# Patient Record
Sex: Male | Born: 1990 | Race: Black or African American | Hispanic: No | Marital: Single | Smoking: Former smoker
Health system: Southern US, Community
[De-identification: ages and names within clinical notes are randomized; demographics above are authoritative.]

## PROBLEM LIST (undated history)

## (undated) DIAGNOSIS — K649 Unspecified hemorrhoids: Secondary | ICD-10-CM

## (undated) DIAGNOSIS — T884XXA Failed or difficult intubation, initial encounter: Secondary | ICD-10-CM

---
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
## 2009-12-19 ENCOUNTER — Emergency Department (HOSPITAL_COMMUNITY): Admission: EM | Admit: 2009-12-19 | Discharge: 2009-12-19 | Payer: Self-pay | Admitting: Emergency Medicine

## 2010-06-22 ENCOUNTER — Emergency Department (HOSPITAL_COMMUNITY): Admission: EM | Admit: 2010-06-22 | Discharge: 2010-06-22 | Payer: Self-pay | Admitting: Emergency Medicine

## 2010-12-21 LAB — URINALYSIS, ROUTINE W REFLEX MICROSCOPIC
Bilirubin Urine: NEGATIVE
Glucose, UA: NEGATIVE mg/dL
Hgb urine dipstick: NEGATIVE
Ketones, ur: NEGATIVE mg/dL
Protein, ur: NEGATIVE mg/dL

## 2010-12-21 LAB — URINE MICROSCOPIC-ADD ON

## 2011-03-07 ENCOUNTER — Emergency Department (HOSPITAL_COMMUNITY)
Admission: EM | Admit: 2011-03-07 | Discharge: 2011-03-08 | Disposition: A | Discharge status: Home / Self Care | Payer: Self-pay | Attending: Emergency Medicine | Admitting: Emergency Medicine

## 2011-03-07 DIAGNOSIS — R51 Headache: Secondary | ICD-10-CM | POA: Insufficient documentation

## 2011-03-07 DIAGNOSIS — Y92410 Unspecified street and highway as the place of occurrence of the external cause: Secondary | ICD-10-CM | POA: Insufficient documentation

## 2011-03-07 DIAGNOSIS — S139XXA Sprain of joints and ligaments of unspecified parts of neck, initial encounter: Secondary | ICD-10-CM | POA: Insufficient documentation

## 2011-03-08 ENCOUNTER — Encounter (HOSPITAL_COMMUNITY): Payer: Self-pay

## 2011-03-08 ENCOUNTER — Emergency Department (HOSPITAL_COMMUNITY): Payer: Self-pay

## 2011-03-29 ENCOUNTER — Emergency Department (HOSPITAL_COMMUNITY)
Admission: EM | Admit: 2011-03-29 | Discharge: 2011-03-30 | Disposition: A | Discharge status: Other Acute Inpatient Hospital | Payer: Self-pay | Attending: Emergency Medicine | Admitting: Emergency Medicine

## 2011-03-29 DIAGNOSIS — R45851 Suicidal ideations: Secondary | ICD-10-CM | POA: Insufficient documentation

## 2011-03-29 DIAGNOSIS — Z5900 Homelessness unspecified: Secondary | ICD-10-CM | POA: Insufficient documentation

## 2011-03-29 DIAGNOSIS — F3289 Other specified depressive episodes: Secondary | ICD-10-CM | POA: Insufficient documentation

## 2011-03-29 DIAGNOSIS — F329 Major depressive disorder, single episode, unspecified: Secondary | ICD-10-CM | POA: Insufficient documentation

## 2011-03-29 LAB — COMPREHENSIVE METABOLIC PANEL
ALT: 22 U/L (ref 0–53)
AST: 24 U/L (ref 0–37)
CO2: 28 mEq/L (ref 19–32)
Chloride: 102 mEq/L (ref 96–112)
GFR calc non Af Amer: >60 mL/min (ref >60)
Total Bilirubin: 0.7 mg/dL (ref 0.3–1.2)

## 2011-03-29 LAB — DIFFERENTIAL
Basophils Absolute: 0 10*3/uL (ref 0.0–0.1)
Basophils Relative: 0 % (ref 0–1)
Eosinophils Relative: 2 % (ref 0–5)
Lymphs Abs: 1.9 10*3/uL (ref 0.7–4.0)
Monocytes Relative: 12 % (ref 3–12)

## 2011-03-29 LAB — ETHANOL: Alcohol, Ethyl (B): <11 mg/dL (ref 0–11)

## 2011-03-29 LAB — CBC
Platelets: 279 10*3/uL (ref 150–400)
RDW: 12.4 % (ref 11.5–15.5)

## 2011-03-30 ENCOUNTER — Inpatient Hospital Stay (HOSPITAL_COMMUNITY)
Admission: AD | Admit: 2011-03-30 | Discharge: 2011-04-02 | DRG: 885 | Disposition: A | Discharge status: Home / Self Care | Payer: PRIVATE HEALTH INSURANCE | Attending: Psychiatry | Admitting: Psychiatry

## 2011-03-30 DIAGNOSIS — F329 Major depressive disorder, single episode, unspecified: Principal | ICD-10-CM

## 2011-03-30 DIAGNOSIS — R45851 Suicidal ideations: Secondary | ICD-10-CM

## 2011-03-30 DIAGNOSIS — Z560 Unemployment, unspecified: Secondary | ICD-10-CM

## 2011-03-30 DIAGNOSIS — F1210 Cannabis abuse, uncomplicated: Secondary | ICD-10-CM

## 2011-03-30 DIAGNOSIS — Z818 Family history of other mental and behavioral disorders: Secondary | ICD-10-CM

## 2011-03-31 DIAGNOSIS — F39 Unspecified mood [affective] disorder: Secondary | ICD-10-CM

## 2011-04-02 NOTE — H&P (Signed)
NAMEFILMORE, MOLYNEUX NO.:  [id number]  MEDICAL RECORD NO.:  [id number]  LOCATION:  0504                          FACILITY:  [facility]  PHYSICIAN:  Franchot Gallo, MD     DATE OF BIRTH:  [birth year]  DATE OF ADMISSION:  03/30/2011 DATE OF DISCHARGE:                      PSYCHIATRIC ADMISSION ASSESSMENT   IDENTIFYING INFORMATION:  This is a 20 year old male who was involuntarily petitioned on 03/30/2011.  HISTORY OF PRESENT ILLNESS:  The patient presents with papers, stating that the patient has suicidal thoughts.  The patient tried to strangle himself in the Emergency Room, having thoughts of cutting himself; several stressors and triggers for increasing depression and suicidal thoughts.  The respondent's main stressor consists of being part of a gang that he is unable to get out of.  The patient does report that he is having a lot of stress with the mothers of his children.  He has two children and one on the way: one born last week, a 96-year-old, and one girlfriend pregnant.  He states that they have not been wanting him to see his children, but then they have changed their minds, and the situation may change.  He does report depression, currently denying any suicidal thoughts.  He does report that he tried to strangle himself in the Emergency Room but denies any thoughts currently.  He states that he currently is out of the gang.  The patient also endorses having difficulty with anger, lashing out and getting agitated at times.  PAST PSYCHIATRIC HISTORY:  His first admission to the [facility].  No other psychiatric admissions.  Reports being prescribed an anti-depressant in the past but has not taken it and cannot recall the name.  SOCIAL HISTORY:  A 20 year old male who lives with his mother.  He has a brother as well who resides in the home.  He is single, but he does have two children and one on the way.  He is currently unemployed.  FAMILY  HISTORY:  Mother with a history of depression, not on any medications.  ALCOHOL AND DRUG HISTORY:  Urine drug screen is positive for marijuana.  PRIMARY CARE PROVIDER:  None.  MEDICAL PROBLEMS:  The patient denies any acute or chronic health issues.  MEDICATIONS:  None.  DRUG ALLERGIES:  None.  PHYSICAL EXAM:  This is a well-developed male, assessed in the Emergency Department; no significant findings.  Laboratory data shows a urine drug screen positive for cannabis; alcohol level less than 11; CBC and CMP within normal limits.  MENTAL STATUS EXAM:  He is in bed.  He is easily awakened and cooperative.  Speech is soft-spoken, polite.  Mood is depressed.  He does appear somewhat sad.  Thought processes are coherent, goal- directed.  No evidence of any psychotic symptoms.  Cognitive function intact.  Memory appears intact.  Judgment and insight are fair.  DIAGNOSIS:  AXIS I:  Major depressive disorder, cannabis abuse. AXIS II:  Deferred. AXIS III:  No known medical conditions. AXIS IV:  Problems with occupation, possible psychosocial problems. AXIS V:  Current is 30.  Our plan is to initiate Celexa, which was discussed with the patient. The patient is agreeable to medications.  Will contact mother for support concerns.  We will address his substance use and will continue to assess comorbidities.  His tentative length of stay at this time is 3 to 5 days.     Landry Corporal, N.P.   ______________________________ Franchot Gallo, MD    JO/MEDQ  D:  03/31/2011  T:  03/31/2011  Job:  [id number]  Electronically Signed by Limmie PatriciaP. on 04/02/2011 10:25:46 AM Electronically Signed by Franchot Gallo MD on 04/02/2011 02:10:07 PM

## 2011-04-02 NOTE — Discharge Summary (Signed)
NAMECECIL, VANDYKE NO.:  [id number]  MEDICAL RECORD NO.:  [id number]  LOCATION:  0504                          FACILITY:  [facility]  PHYSICIAN:  Franchot Gallo, MD     DATE OF BIRTH:  [birth year]  DATE OF ADMISSION:  03/30/2011 DATE OF DISCHARGE:  04/02/2011                              DISCHARGE SUMMARY   Darryl Lloyd was admitted to [facility] on March 30, 2011, after being evaluated in the emergency department of [facility] and voicing severe depression as well as suicidal ideations.  There was also mention in the ED that he attempted to "strangle himself" while in the ED.  The patient was seen by Dr. Lolly Mustache who was the on-call physician on March 31, 2011, at approximately 10:00 a.m.  He was placed on Celexa at 10 mg p.o. q.a.m. as well as Vistaril 50 mg for sleep.  The patient was next seen by the on-call staff on April 01, 2011, and at that time stated that he was "ready to go home".  He reportedly denied any suicidal or homicidal ideations and denied any side effects to the medication, Celexa.  The patient was first seen by this physician on April 02, 2011, and reported he was feeling well.  He stated he was sleeping well without difficulty and reported a good appetite and rated his depression as a "zero" on a scale of 1-10.  He denied any feelings of sadness, anhedonia or depressed mood.  He adamantly denied any suicidal or homicidal ideations and also denied any auditory or visual hallucinations or delusional thinking.  He stated that his anxiety was under good control and he denied any medication related side effects.  The patient requested to be discharged from the hospital and after meeting with the treatment team it was decided that he would be appropriate for discharge.  The nurse practitioner chose to increase his medication, Celexa, to the starting dose of 20 mg p.o. q.a.m. just prior to discharge.  FINAL DIAGNOSES:   AXIS I:  Major  depressive disorder, single episode,          currently under good control.   Cannabis Abuse. AXIS II:  Deferred. AXIS III:  No health issues reported. AXIS IV:  Some difficulty with primary support system.  Unemployment. Financial constraints. AXIS V:  GAF at time of admission approximately 35.  GAF at time of discharge approximately 70.  DISCHARGE MEDICATIONS:   1.  Celexa 20 mg p.o. q.a.m.  CONDITION AT DISCHARGE:  The patient was alert and oriented x3 and was friendly and cooperative with this provider.  Speech was appropriate in terms of rate and volume.  Mood was euthymic.  Affect was bright and full.  Thoughts, the patient denied any obvious delusions or hallucinations nor did he report any suicidal or homicidal ideations. Judgment and insight both appeared to be fair to good.  FOLLOWUP INSTRUCTIONS:  The patient will follow up at [facility] tomorrow April 03, 2011, between 8 am and 11 am for continuing followup of his  psychiatric symptoms.    _________________________________ Franchot Gallo, MD     RR/MEDQ  D:  04/02/2011  T:  04/02/2011  Job:  [id number]  Electronically Signed by Franchot Gallo MD on 04/02/2011 02:59:59 PM

## 2011-06-11 ENCOUNTER — Emergency Department (HOSPITAL_COMMUNITY): Payer: Self-pay

## 2011-06-11 ENCOUNTER — Emergency Department (HOSPITAL_COMMUNITY)
Admission: EM | Admit: 2011-06-11 | Discharge: 2011-06-11 | Disposition: A | Discharge status: Home / Self Care | Payer: Self-pay | Attending: Emergency Medicine | Admitting: Emergency Medicine

## 2011-06-11 DIAGNOSIS — R109 Unspecified abdominal pain: Secondary | ICD-10-CM | POA: Insufficient documentation

## 2011-06-11 DIAGNOSIS — R42 Dizziness and giddiness: Secondary | ICD-10-CM | POA: Insufficient documentation

## 2011-06-11 DIAGNOSIS — K625 Hemorrhage of anus and rectum: Secondary | ICD-10-CM | POA: Insufficient documentation

## 2011-06-11 LAB — CBC
Hemoglobin: 14 g/dL (ref 13.0–17.0)
MCH: 30.3 pg (ref 26.0–34.0)
MCV: 87.9 fL (ref 78.0–100.0)
Platelets: 309 10*3/uL (ref 150–400)
RDW: 12.4 % (ref 11.5–15.5)
WBC: 5.6 10*3/uL (ref 4.0–10.5)

## 2011-06-11 LAB — DIFFERENTIAL
Eosinophils Relative: 3 % (ref 0–5)
Lymphocytes Relative: 43 % (ref 12–46)
Monocytes Absolute: 0.6 10*3/uL (ref 0.1–1.0)
Monocytes Relative: 11 % (ref 3–12)
Neutro Abs: 2.4 10*3/uL (ref 1.7–7.7)

## 2011-06-11 LAB — COMPREHENSIVE METABOLIC PANEL
ALT: 18 U/L (ref 0–53)
Alkaline Phosphatase: 91 U/L (ref 39–117)
BUN: 6 mg/dL (ref 6–23)
CO2: 28 mEq/L (ref 19–32)
Calcium: 9.4 mg/dL (ref 8.4–10.5)
Creatinine, Ser: 0.68 mg/dL (ref 0.50–1.35)
Sodium: 141 mEq/L (ref 135–145)
Total Bilirubin: 0.2 mg/dL — ABNORMAL LOW (ref 0.3–1.2)

## 2011-06-11 MED ORDER — IOHEXOL 300 MG/ML  SOLN
80.0000 mL | Freq: Once | INTRAMUSCULAR | Status: AC | PRN
  Administered 2011-06-11: 80 mL via INTRAVENOUS

## 2011-08-02 ENCOUNTER — Emergency Department (HOSPITAL_COMMUNITY)
Admission: EM | Admit: 2011-08-02 | Discharge: 2011-08-02 | Disposition: A | Discharge status: Home / Self Care | Payer: Self-pay | Attending: Emergency Medicine | Admitting: Emergency Medicine

## 2011-08-02 DIAGNOSIS — Z79899 Other long term (current) drug therapy: Secondary | ICD-10-CM | POA: Insufficient documentation

## 2011-08-02 DIAGNOSIS — F329 Major depressive disorder, single episode, unspecified: Secondary | ICD-10-CM | POA: Insufficient documentation

## 2011-08-02 DIAGNOSIS — K649 Unspecified hemorrhoids: Secondary | ICD-10-CM | POA: Insufficient documentation

## 2011-08-02 DIAGNOSIS — F411 Generalized anxiety disorder: Secondary | ICD-10-CM | POA: Insufficient documentation

## 2011-08-02 DIAGNOSIS — F3289 Other specified depressive episodes: Secondary | ICD-10-CM | POA: Insufficient documentation

## 2011-08-02 LAB — COMPREHENSIVE METABOLIC PANEL
ALT: 22 U/L (ref 0–53)
AST: 18 U/L (ref 0–37)
Albumin: 4.4 g/dL (ref 3.5–5.2)
Alkaline Phosphatase: 107 U/L (ref 39–117)
CO2: 28 mEq/L (ref 19–32)
Chloride: 104 mEq/L (ref 96–112)
GFR calc non Af Amer: >90 mL/min (ref >90)
Potassium: 3.5 mEq/L (ref 3.5–5.1)
Sodium: 140 mEq/L (ref 135–145)
Total Bilirubin: 0.3 mg/dL (ref 0.3–1.2)

## 2011-08-02 LAB — CBC
MCH: 30.3 pg (ref 26.0–34.0)
RBC: 4.78 MIL/uL (ref 4.22–5.81)

## 2011-08-02 LAB — DIFFERENTIAL
Basophils Relative: 0 % (ref 0–1)
Eosinophils Absolute: 0.1 10*3/uL (ref 0.0–0.7)
Eosinophils Relative: 2 % (ref 0–5)
Lymphocytes Relative: 55 % — ABNORMAL HIGH (ref 12–46)
Lymphs Abs: 3.4 10*3/uL (ref 0.7–4.0)
Monocytes Absolute: 0.5 10*3/uL (ref 0.1–1.0)

## 2011-08-02 LAB — OCCULT BLOOD, POC DEVICE: Fecal Occult Bld: NEGATIVE

## 2011-08-02 LAB — RAPID URINE DRUG SCREEN, HOSP PERFORMED
Barbiturates: NOT DETECTED
Tetrahydrocannabinol: POSITIVE — AB

## 2013-09-16 ENCOUNTER — Encounter (HOSPITAL_COMMUNITY): Payer: Self-pay | Admitting: Emergency Medicine

## 2013-09-16 ENCOUNTER — Emergency Department (HOSPITAL_COMMUNITY): Payer: Self-pay

## 2013-09-16 ENCOUNTER — Emergency Department (HOSPITAL_COMMUNITY)
Admission: EM | Admit: 2013-09-16 | Discharge: 2013-09-16 | Disposition: A | Discharge status: Home / Self Care | Payer: Self-pay | Attending: Emergency Medicine | Admitting: Emergency Medicine

## 2013-09-16 DIAGNOSIS — M25572 Pain in left ankle and joints of left foot: Secondary | ICD-10-CM

## 2013-09-16 DIAGNOSIS — M25579 Pain in unspecified ankle and joints of unspecified foot: Secondary | ICD-10-CM | POA: Insufficient documentation

## 2013-09-16 DIAGNOSIS — F17200 Nicotine dependence, unspecified, uncomplicated: Secondary | ICD-10-CM | POA: Insufficient documentation

## 2013-09-16 MED ORDER — MELOXICAM 7.5 MG PO TABS: 15.0000 mg | ORAL_TABLET | Freq: Every day | ORAL | Status: DC

## 2013-09-16 MED ORDER — KETOROLAC TROMETHAMINE 60 MG/2ML IM SOLN
60.0000 mg | Freq: Once | INTRAMUSCULAR | Status: AC
  Administered 2013-09-16: 60 mg via INTRAMUSCULAR
  Filled 2013-09-16: qty 2

## 2013-09-16 NOTE — ED Notes (Signed)
Left ankle pain, sudden onset today, no trauma, no deformity.

## 2013-09-16 NOTE — ED Provider Notes (Signed)
Medical screening examination/treatment/procedure(s) were performed by non-physician practitioner and as supervising physician I was immediately available for consultation/collaboration.  [name], MD 09/16/13 [phone]

## 2013-09-16 NOTE — ED Provider Notes (Signed)
CSN: [id number]     Arrival date & time 09/16/13  1333 History   First MD Initiated Contact with Patient 09/16/13 1400    This chart was scribed for Darryl Silk  PA-C, a non-physician practitioner working with Darryl Baker, MD by Darryl Lloyd, ED Scribe. This patient was seen in room TR06C/TR06C and the patient's care was started at 2:26 PM     Chief Complaint  Patient presents with  . Ankle Pain   (Consider location/radiation/quality/duration/timing/severity/associated sxs/prior Treatment) The history is provided by the patient. No language interpreter was used.   HPI Comments: Darryl Lloyd is a 22 y.o. male who presents to the Emergency Department complaining of constant severe medial left ankle pain onset today while walking. Describes pain as sharp without radiation. Reports pain is exacerbated by movement and weight bearing. Denies trying any medications PTA to relieve symptoms. Denies associated injury, numbness, and weakness. Denies pertinent PMHx.   History reviewed. No pertinent past medical history. History reviewed. No pertinent past surgical history. No family history on file. History  Substance Use Topics  . Smoking status: Current Every Day Smoker  . Smokeless tobacco: Not on file  . Alcohol Use: No    Review of Systems  Constitutional: Negative for fever.  Musculoskeletal: Positive for arthralgias.  Psychiatric/Behavioral: Negative for confusion.    A complete 10 system review of systems was obtained and all systems are negative except as noted in the HPI and PMHx.    Allergies  Review of patient's allergies indicates no known allergies.  Home Medications  No current outpatient prescriptions on file. BP 103/59  Pulse 70  Temp(Src) 98.3 F (36.8 C) (Oral)  Resp 16  SpO2 99% Physical Exam  Nursing note and vitals reviewed. Constitutional: He is oriented to person, place, and time. He appears well-developed and well-nourished. No distress.   HENT:  Head: Normocephalic and atraumatic.  Right Ear: External ear normal.  Left Ear: External ear normal.  Nose: Nose normal.  Eyes: Conjunctivae are normal.  Neck: Normal range of motion. No tracheal deviation present.  Cardiovascular: Normal rate, regular rhythm and normal heart sounds.   Pulmonary/Chest: Effort normal and breath sounds normal. No stridor.  Abdominal: Soft. He exhibits no distension. There is no tenderness.  Musculoskeletal: Normal range of motion.    Ankle Tenderness entire joint, Distal fibula non-tender, Medial malleolus TTP, Achilles non-tender, Proximal fibula non-tender, Proximal 5th metatarsal non-tender, Midfoot non-tender, distally Neurovascularly Intact with baseline sensation/motor to foot with Cap refill<2 seconds.   Neurological: He is alert and oriented to person, place, and time.  Skin: Skin is warm and dry. He is not diaphoretic. No erythema.  Left ankle: Compartment soft, no erythema, no swelling, no ecchymosis    Psychiatric: He has a normal mood and affect. His behavior is normal.    ED Course  Procedures (including critical care time) COORDINATION OF CARE:  Nursing notes reviewed. Vital signs reviewed. Initial pt interview and examination performed.   2:39 PM-Discussed work up plan with pt at bedside, which includes x-ray of left ankle. Pt agrees with plan. 2:39 PM Nursing Notes Reviewed/ Care Coordinated Applicable Imaging Reviewed  Interpretation of Laboratory Data incorporated into ED treatment Discussed results and treatment plan with pt. Pt demonstrates understanding and agrees with plan.   Treatment plan initiated: Medications  ketorolac (TORADOL) injection 60 mg (60 mg Intramuscular Given 09/16/13 1451)     Initial diagnostic testing ordered.    Labs Review Labs Reviewed - No data to display  Imaging Review Dg Ankle Complete Left  09/16/2013   CLINICAL DATA:  Ankle pain, no trauma  EXAM: LEFT ANKLE COMPLETE - 3+ VIEW   COMPARISON:  02/28/2008  FINDINGS: There is no evidence of fracture, dislocation, or joint effusion. There is no evidence of arthropathy or other focal bone abnormality.  IMPRESSION: Negative.   Electronically Signed   By: Darryl Lloyd M.D.   On: 09/16/2013 14:29    EKG Interpretation   None       MDM   1. Ankle pain, left    Patient presents with sudden onset left ankle pain. Neurovascularly intact. Compartment soft. No swelling, ecchymosis, erythema, rash. XR is negative for acute pathology. Crutches given for comfort. Toradol given. Mobic rx for home. Return instructions given. Vital signs stable for discharge. Patient / Family / Caregiver informed of clinical course, understand medical decision-making process, and agree with plan.   I personally performed the services described in this documentation, which was scribed in my presence. The recorded information has been reviewed and is accurate.     Darryl Bellman, PA-C 09/16/13 [phone]

## 2013-09-16 NOTE — ED Notes (Signed)
Pt states he was walking today and began having severe L ankle pain.  Pt denies any injury.

## 2014-10-14 ENCOUNTER — Encounter (HOSPITAL_COMMUNITY): Payer: Self-pay | Admitting: Emergency Medicine

## 2014-10-14 ENCOUNTER — Emergency Department (HOSPITAL_COMMUNITY): Payer: PRIVATE HEALTH INSURANCE

## 2014-10-14 ENCOUNTER — Emergency Department (HOSPITAL_COMMUNITY)
Admission: EM | Admit: 2014-10-14 | Discharge: 2014-10-14 | Disposition: A | Discharge status: Home / Self Care | Payer: PRIVATE HEALTH INSURANCE | Attending: Emergency Medicine | Admitting: Emergency Medicine

## 2014-10-14 DIAGNOSIS — Z720 Tobacco use: Secondary | ICD-10-CM | POA: Insufficient documentation

## 2014-10-14 DIAGNOSIS — R0789 Other chest pain: Secondary | ICD-10-CM | POA: Insufficient documentation

## 2014-10-14 LAB — BASIC METABOLIC PANEL
ANION GAP: 3 — AB (ref 5–15)
BUN: 12 mg/dL (ref 6–23)
CALCIUM: 8.8 mg/dL (ref 8.4–10.5)
CO2: 29 mmol/L (ref 19–32)
Chloride: 106 mEq/L (ref 96–112)
Creatinine, Ser: 1.1 mg/dL (ref 0.50–1.35)
Glucose, Bld: 66 mg/dL — ABNORMAL LOW (ref 70–99)
Potassium: 3.6 mmol/L (ref 3.5–5.1)
SODIUM: 138 mmol/L (ref 135–145)

## 2014-10-14 LAB — CBC
HCT: 39.1 % (ref 39.0–52.0)
Hemoglobin: 13.1 g/dL (ref 13.0–17.0)
MCH: 30.6 pg (ref 26.0–34.0)
MCHC: 33.5 g/dL (ref 30.0–36.0)
MCV: 91.4 fL (ref 78.0–100.0)
PLATELETS: 291 10*3/uL (ref 150–400)
RBC: 4.28 MIL/uL (ref 4.22–5.81)
RDW: 12.6 % (ref 11.5–15.5)
WBC: 6.1 10*3/uL (ref 4.0–10.5)

## 2014-10-14 LAB — I-STAT TROPONIN, ED: Troponin i, poc: 0 ng/mL (ref 0.00–0.08)

## 2014-10-14 MED ORDER — IBUPROFEN 800 MG PO TABS: 800.0000 mg | ORAL_TABLET | Freq: Three times a day (TID) | ORAL | Status: DC | PRN

## 2014-10-14 MED ORDER — KETOROLAC TROMETHAMINE 60 MG/2ML IM SOLN
60.0000 mg | Freq: Once | INTRAMUSCULAR | Status: AC
  Administered 2014-10-14: 60 mg via INTRAMUSCULAR
  Filled 2014-10-14: qty 2

## 2014-10-14 MED ORDER — HYDROCODONE-ACETAMINOPHEN 5-325 MG PO TABS: 1.0000 | ORAL_TABLET | Freq: Four times a day (QID) | ORAL | Status: DC | PRN

## 2014-10-14 NOTE — Discharge Instructions (Signed)
Return here as needed. Follow up with a primary doctor or urgent care as needed. Use heat on your chest wall.

## 2014-10-14 NOTE — ED Notes (Addendum)
Pt reports R side chest pain that started 2-3 days ago upon awakening. Over time has developed lightheadnesss. Reports sob, but speaking in complete sentences with no difficulty. No cough at home. Pain worse with movement

## 2014-10-14 NOTE — Progress Notes (Signed)
  CARE MANAGEMENT ED NOTE 10/14/2014  Patient:  Darryl Lloyd,Darryl Lloyd   Account Number:  [id number]  Date Initiated:  10/14/2014  Documentation initiated by:  Radford PaxFERRERO,Umair Rosiles  Subjective/Objective Assessment:   Patient presents to Ed with chest pain, sob, lightheadedness     Subjective/Objective Assessment Detail:     Action/Plan:   Action/Plan Detail:   Anticipated DC Date:  10/14/2014     Status Recommendation to Physician:   Result of Recommendation:    Other ED Services  Consult Working Plan    DC Planning Services  Other  PCP issues    Choice offered to / List presented to:            Status of service:  Completed, signed off  ED Comments:   ED Comments Detail:  EDCM spoke to patient at bedside. Patient confirms he does not have a pcp or [place]. EDCM [facility], informed patient of services there and walk in times.  EDCM also provided patient with list of pcps who accept self pay patients, list of discount pharmacies and websites needymeds.org and GoodRX.com for medication assistance, phone number to inquire about the orange card, phone number to inquire about Mediciad, phone number to inquire about the Affordable Care Act, financial resources in the community such as local churches, [employer], urban ministries, and dental assistance for uninsured patients. Patient thankful for resources.  No further EDCM needs at this time.

## 2014-10-14 NOTE — ED Notes (Signed)
PA at bedside.

## 2014-10-14 NOTE — ED Notes (Signed)
PA student at bedside.

## 2014-10-14 NOTE — ED Notes (Signed)
Pt reports Right sided chest pain  Starting 2-3 days ago. He denies and injury or drug use. He c/o pain with movement and pain when leaning back.

## 2014-10-14 NOTE — ED Provider Notes (Signed)
CSN: [id number]     Arrival date & time 10/14/14  1822 History   First MD Initiated Contact with Patient 10/14/14 2018     Chief Complaint  Patient presents with  . Chest Pain     (Consider location/radiation/quality/duration/timing/severity/associated sxs/prior Treatment) Patient is a 24 y.o. male presenting with chest pain.  Chest Pain Pain location:  R lateral chest Pain quality: sharp and stabbing   Pain radiates to:  Precordial region Pain radiates to the back: no   Pain severity:  Moderate Onset quality:  Gradual Duration:  2 days Timing:  Constant Progression:  Unchanged Chronicity:  New Context: breathing, lifting, movement and raising an arm   Context: no drug use, not eating, no intercourse, not at rest, no stress and no trauma   Relieved by:  Nothing Worsened by:  Certain positions, coughing, deep breathing and movement Ineffective treatments:  None tried Associated symptoms: no abdominal pain, no altered mental status, no anorexia, no back pain, no cough, no diaphoresis, no dizziness, no dysphagia, no fatigue, no fever, no headache, no nausea, no near-syncope, no numbness, no orthopnea, no palpitations, no PND, no shortness of breath, no syncope, not vomiting and no weakness   Risk factors: no birth control, no coronary artery disease, no diabetes mellitus, no high cholesterol, no hypertension, no immobilization, not obese, no prior DVT/PE, no smoking and no surgery     History reviewed. No pertinent past medical history. History reviewed. No pertinent past surgical history. History reviewed. No pertinent family history. History  Substance Use Topics  . Smoking status: Current Every Day Smoker  . Smokeless tobacco: Not on file  . Alcohol Use: No    Review of Systems  Constitutional: Negative for fever, diaphoresis and fatigue.  HENT: Negative for trouble swallowing.   Respiratory: Negative for cough and shortness of breath.   Cardiovascular: Positive for chest  pain. Negative for palpitations, orthopnea, syncope, PND and near-syncope.  Gastrointestinal: Negative for nausea, vomiting, abdominal pain and anorexia.  Genitourinary: Negative for dysuria.  Musculoskeletal: Negative for back pain and neck pain.  Skin: Negative for rash.  Neurological: Negative for dizziness, syncope, weakness, numbness and headaches.    All other systems negative except as documented in the HPI. All pertinent positives and negatives as reviewed in the HPI.   Allergies  Review of patient's allergies indicates no known allergies.  Home Medications   Prior to Admission medications   Medication Sig Start Date End Date Taking? Authorizing Provider  meloxicam (MOBIC) 7.5 MG tablet Take 2 tablets (15 mg total) by mouth daily. Patient not taking: Reported on 10/14/2014 09/16/13   Ramon DredgeHannah S Merrell, PA-C   BP 118/76 mmHg  Pulse 75  Temp(Src) 98.2 F (36.8 C) (Oral)  Resp 16  SpO2 99% Physical Exam  Constitutional: He is oriented to person, place, and time. He appears well-developed and well-nourished. No distress.  HENT:  Head: Normocephalic and atraumatic.  Mouth/Throat: Oropharynx is clear and moist.  Eyes: Pupils are equal, round, and reactive to light.  Neck: Normal range of motion. Neck supple.  Cardiovascular: Normal rate, regular rhythm and normal heart sounds.  Exam reveals no gallop and no friction rub.   No murmur heard. Pulmonary/Chest: Effort normal and breath sounds normal. No respiratory distress. He exhibits tenderness.  Abdominal: Soft. Bowel sounds are normal. He exhibits no distension. There is no tenderness.  Musculoskeletal: He exhibits no edema.  Neurological: He is alert and oriented to person, place, and time. He exhibits normal muscle tone.  Coordination normal.  Skin: Skin is warm and dry. No rash noted. No erythema.  Nursing note and vitals reviewed.   ED Course  Procedures (including critical care time) Labs Review Labs Reviewed  BASIC  METABOLIC PANEL - Abnormal; Notable for the following:    Glucose, Bld 66 (*)    Anion gap 3 (*)    All other components within normal limits  CBC  I-STAT TROPOININ, ED    Imaging Review Dg Chest 2 View (if Patient Has Fever And/or Copd)  10/14/2014   CLINICAL DATA:  Right upper chest pain 2 days.  No injury.  EXAM: CHEST  2 VIEW  COMPARISON:  None.  FINDINGS: The heart size and mediastinal contours are within normal limits. Both lungs are clear. The visualized skeletal structures are unremarkable.  IMPRESSION: No active cardiopulmonary disease.   Electronically Signed   By: Elberta Fortis M.D.   On: 10/14/2014 19:06     EKG Interpretation None      The patient will be treated for chest wall pain based on HPI and PE. The patient is advised to return here as needed. Use heat on the area.     Carlyle Dolly, PA-C 10/15/14 0123  Rolland Porter, MD 10/26/14 [phone]

## 2015-02-09 ENCOUNTER — Emergency Department (HOSPITAL_COMMUNITY)
Admission: EM | Admit: 2015-02-09 | Discharge: 2015-02-10 | Disposition: A | Discharge status: Home / Self Care | Payer: PRIVATE HEALTH INSURANCE | Attending: Emergency Medicine | Admitting: Emergency Medicine

## 2015-02-09 DIAGNOSIS — K029 Dental caries, unspecified: Secondary | ICD-10-CM | POA: Insufficient documentation

## 2015-02-09 DIAGNOSIS — K0889 Other specified disorders of teeth and supporting structures: Secondary | ICD-10-CM

## 2015-02-09 DIAGNOSIS — Z720 Tobacco use: Secondary | ICD-10-CM | POA: Insufficient documentation

## 2015-02-09 DIAGNOSIS — K088 Other specified disorders of teeth and supporting structures: Secondary | ICD-10-CM | POA: Insufficient documentation

## 2015-02-10 ENCOUNTER — Encounter (HOSPITAL_COMMUNITY): Payer: Self-pay | Admitting: *Deleted

## 2015-02-10 MED ORDER — TRAMADOL HCL 50 MG PO TABS: 50.0000 mg | ORAL_TABLET | Freq: Four times a day (QID) | ORAL | Status: DC | PRN

## 2015-02-10 MED ORDER — BUPIVACAINE-EPINEPHRINE (PF) 0.5% -1:200000 IJ SOLN
3.6000 mL | Freq: Once | INTRAMUSCULAR | Status: AC
  Administered 2015-02-10: 3.6 mL
  Filled 2015-02-10: qty 3.6

## 2015-02-10 MED ORDER — NAPROXEN 500 MG PO TABS: 500.0000 mg | ORAL_TABLET | Freq: Two times a day (BID) | ORAL | Status: DC

## 2015-02-10 MED ORDER — PENICILLIN V POTASSIUM 500 MG PO TABS: 500.0000 mg | ORAL_TABLET | Freq: Four times a day (QID) | ORAL | Status: AC

## 2015-02-10 MED ORDER — NAPROXEN 500 MG PO TABS
500.0000 mg | ORAL_TABLET | Freq: Once | ORAL | Status: AC
  Administered 2015-02-10: 500 mg via ORAL
  Filled 2015-02-10: qty 1

## 2015-02-10 NOTE — Discharge Instructions (Signed)
Please follow the directions provided. It is very important to follow-up with the dentist tomorrow for further management of this tooth pain. Please take the proximal and twice a day to help with pain and inflammation. Please take the Ultram for pain not relieved by the naproxen. Please take the penicillin until it is all gone. Don't hesitate to return for any new, worsening, or concerning symptoms.   SEEK IMMEDIATE MEDICAL CARE IF:  You have a fever.  You develop redness and swelling of your face, jaw, or neck.  You are unable to open your mouth.  You have severe pain uncontrolled by pain medicine.   Emergency Department Resource Guide 1) Find a Doctor and Pay Out of Pocket Although you won't have to find out who is covered by your insurance plan, it is a good idea to ask around and get recommendations. You will then need to call the office and see if the doctor you have chosen will accept you as a new patient and what types of options they offer for patients who are self-pay. Some doctors offer discounts or will set up payment plans for their patients who do not have insurance, but you will need to ask so you aren't surprised when you get to your appointment.  2) Contact Your Local Health Department Not all health departments have doctors that can see patients for sick visits, but many do, so it is worth a call to see if yours does. If you don't know where your local health department is, you can check in your phone book. The CDC also has a tool to help you locate your state's health department, and many state websites also have listings of all of their local health departments.  3) Find a Walk-in Clinic If your illness is not likely to be very severe or complicated, you may want to try a walk in clinic. These are popping up all over the country in pharmacies, drugstores, and shopping centers. They're usually staffed by nurse practitioners or physician assistants that have been trained to treat  common illnesses and complaints. They're usually fairly quick and inexpensive. However, if you have serious medical issues or chronic medical problems, these are probably not your best option.  No Primary Care Doctor: - Call Health Connect at  [phone] - they can help you locate a primary care doctor that  accepts your insurance, provides certain services, etc. - Physician Referral Service- [phone]  Chronic Pain Problems: Organization         Address  Phone   Notes  [facility]  [phone] Patients need to be referred by their primary care doctor.   Medication Assistance: Organization         Address  Phone   Notes  [facility] [address] [place], [place] [address] [phone] --Must be a resident of [place] -- Must have NO insurance coverage whatsoever (no Medicaid/ Medicare, etc.) -- The pt. MUST have a primary care doctor that directs their care regularly and follows them in the community   MedAssist  [phone]   [employer]  [phone]    Agencies that provide inexpensive medical care: Organization         Address  Phone   Notes  [facility]  [phone]   [facility]    [phone]   [facility] [address] [place], [place] [address] [phone]  [facility] [address], [place] [phone]   Planned Parenthood    [phone]   [facility]    [phone]   [place] and [place] [address], [place] Phone:  [phone], Fax:  [phone] Hours of Operation:  9 am - 6 pm, M-F.  Also accepts Medicaid/Medicare and self-pay.  [facility] for [place], [address], [place] Phone: [phone], Fax: [phone]. Hours of Operation:  8:30 am - 5:30 pm, M-F.  Also accepts Medicaid and self-pay.  [facility] [place]  [place] [address] [place] Phone: [phone]   [place], [place], [place] [phone], Ext. [phone] Mondays & Thursdays: 7-9 AM.  First 15 patients are seen on a first come, first serve basis.    [place] Providers:  Organization         Address  Phone   Notes  [facility] [address], [place] [phone] Also accepts self-pay patients.  [facility] [address], [place]  [phone]   [place], [address], [place] [phone]   [facility] [address], [place] [phone]   Lucianne Lei [address], [place]   [phone] Only accepts [place] Access [place] patients after they have their name applied to their card.   Self-Pay (no insurance) in [facility]:  Organization         Address  Phone   Notes  Sickle Cell Patients, [facility] Internal Medicine [place] [phone]   [facility] [place] [phone]   [facility] [place]  [place], [place], [place] [phone]   [facility]/Dr. Osei-Bonsu  [address] [place] or [address], [place] [phone] Phone number for both [place] and [place] locations is the same.  [facility] [address] [place] [phone]   [facility] [address], [place] or [address] [phone]   [facility] [address], [place] [phone], phone; [phone], fax Sees patients 1st and 3rd Saturday of every month.  Must not qualify for public or private insurance (i.e. Medicaid, Medicare, [place] Health Choice, Veterans' Benefits)  Household income should be no more than 200% of the  poverty level The clinic cannot treat you if you are pregnant or think you are pregnant  Sexually transmitted diseases are not treated at the clinic.    Dental Care: Organization         Address  Phone  Notes  [facility] Department of [facility] [place] [phone] Accepts children up to age 78 who are enrolled in [place] or [place]; pregnant women with a Medicaid card; and children who have applied for Medicaid or [place] Health Choice, but were declined, whose parents can pay a reduced fee at time of service.  [facility] Department of [facility]  [address], [place] [phone] Accepts children up to age 72 who are enrolled in [place] or [place]; pregnant women with a Medicaid card; and  children who have applied for Medicaid or [place] Health Choice, but were declined, whose parents can pay a reduced fee at time of service.  [place] Adult [facility] Access PROGRAM  [place] [phone] Patients are seen by appointment only. Walk-ins are not accepted. [place] will see patients 49 years of age and older. Monday - Tuesday (8am-5pm) Most Wednesdays (8:30-5pm) $30 per visit, cash only  [facility] Adult Dental Access PROGRAM  [address], [facility] [phone] Patients are seen by appointment only. Walk-ins are not accepted. [place] will see patients 74 years of age and older. One Wednesday Evening (Monthly: Volunteer Based).  $30 per visit, cash only  [place]  [phone] for adults; Children under age 77, call Graduate Pediatric Dentistry at [phone]. Children aged 60-14, please call [phone] to request a pediatric application.  Dental services are provided in all areas of dental care including fillings, crowns and bridges, complete and partial dentures, implants, gum treatment, root canals, and extractions.  Preventive care is also provided. Treatment is provided to both adults and children. Patients are selected via a lottery and there is often a waiting list.   [facility] [address] [place]  [phone] [email or web address]   [facility] [address] [place], [place] [phone], Ext. [phone] Second and Fourth Thursday of each month, opens at 6:30 AM; Clinic ends at 9 AM.  Patients are seen on a first-come first-served basis, and a limited number are seen during each clinic.   [facility]  [address] Hillard Danker [place], [place] [phone]   Eligibility Requirements You must have lived in [place], [place], or [place] counties for at least the last three months.   You cannot be eligible for state or federal sponsored [employer], including [employer], [place], or [employer].   You generally cannot be eligible for healthcare insurance through your employer.    How to apply: Eligibility screenings are held every Tuesday and Wednesday afternoon from 1:00 pm until 4:00 pm. You do not need an appointment for the interview!  [facility] [address] [place], [place]   [place]  [place] Department  [place]  [phone]    Behavioral Health Resources in the Community: Intensive Outpatient Programs Organization         Address  Phone  Notes  [place]. [address] [place], [place] [phone]   [facility] Outpatient [address] [place], [place]   ADS: Alcohol & Drug Svcs [address], [place], [place]   [place] [address],  [place], [place] or [phone]   Substance Abuse Resources Organization         Address  Phone  Notes  Alcohol and Drug Services  [phone]   [place]  [phone]   [facility]   Chinita Pester  [phone]   Residential & Outpatient Substance Abuse Program  [phone]   Psychological Services Organization         Address  Phone  Notes  [facility] [place]  [place]  [phone]   [place] [address] [place] or [phone]    Mobile Crisis Teams Organization  Address  Phone  Notes  [facility], Mobile Crisis Care Unit  [phone]   [facility]ive Psychotherapeutic Services  [address] [place], [place]   [facility] [address] [place] [phone]    Self-Help/Support Groups Organization         Address  Phone             Notes  [place]. of [place] - variety of support groups  [place] Call for more information  Narcotics Anonymous (NA), [facility] [address], [employer] [place]  2 meetings at this location   Special educational needs teacher         Address  Phone  Notes  ASAP Residential Treatment [place],    [place]  [phone]   [facility]  [address], [place] [id number], [place], [place]   [place], [place] [phone] Admissions: 8am-3pm M-F  Incentives Substance [place] [address],    [place], [place] [phone]   [facility] [address] [place], [place], [place]   [facility] [address]  [place], [place]   [facility] - Intensive Outpatient [address], [place], [place]   [facility] ([place].,  [place], [place] [phone] or [phone]   [facility] Services (RTS) [address], [place], [place] Accepts Medicaid  [place] [address],  [place]  [phone] Substance Abuse/Addiction Treatment   [facility] Organization         Address  Phone  Notes  [facility]  [phone]   Domenic Schwab, PhD [address] Arlis Porta [place], [place]   [phone] or [phone]   [place] [address] [place], [place] [phone]   [facility] [address] [place], [place] [phone] Insurance/Medicaid/sponsorship through [facility] and Families [address], [address] [place]                                    [place], [place] [phone] [place] [address], [place] [phone]    Dr. Adele Schilder  [phone]   [facility] Dept. 1) [address], [place] 2) [place] 3)  [place] [address], [place] [phone]  [phone]   [place] [phone] or [phone] (After Hours)

## 2015-02-10 NOTE — ED Notes (Signed)
Pt reports L upper and lower dental pain x 5- 6 months.  Pt reports it's his wisdom teeth.

## 2015-02-10 NOTE — ED Provider Notes (Signed)
CSN: [id number]     Arrival date & time 02/09/15  2329 History   First MD Initiated Contact with Patient 02/09/15 2359     Chief Complaint  Patient presents with  . Dental Pain   (Consider location/radiation/quality/duration/timing/severity/associated sxs/prior Treatment) HPI   Darryl Lloyd is a 24 year old male presenting with dental pain. He states this is been an ongoing problem for "months".  He states he became more bothersome today after eating. He states he has pain in his back upper tooth and back lower teeth. He rates his pain as a 10 out of 10 currently. He denies any fevers, chills, nausea, vomiting or difficulty eating or swallowing.   History reviewed. No pertinent past medical history. History reviewed. No pertinent past surgical history. No family history on file. History  Substance Use Topics  . Smoking status: Current Every Day Smoker -- 0.50 packs/day    Types: Cigarettes  . Smokeless tobacco: Not on file  . Alcohol Use: No    Review of Systems  Constitutional: Negative for fever.  HENT: Positive for dental problem.   Gastrointestinal: Negative for vomiting.      Allergies  Review of patient's allergies indicates no known allergies.  Home Medications   Prior to Admission medications   Medication Sig Start Date End Date Taking? Authorizing Provider  HYDROcodone-acetaminophen (NORCO/VICODIN) 5-325 MG per tablet Take 1 tablet by mouth every 6 (six) hours as needed for moderate pain. 10/14/14   Charlestine Nighthristopher Lawyer, PA-C  ibuprofen (ADVIL,MOTRIN) 800 MG tablet Take 1 tablet (800 mg total) by mouth every 8 (eight) hours as needed. 10/14/14   Charlestine Nighthristopher Lawyer, PA-C  meloxicam (MOBIC) 7.5 MG tablet Take 2 tablets (15 mg total) by mouth daily. Patient not taking: Reported on 10/14/2014 09/16/13   Junious SilkHannah Merrell, PA-C   BP 120/74 mmHg  Pulse 71  Temp(Src) 98.3 F (36.8 C) (Oral)  Resp 17  SpO2 99% Physical Exam  Constitutional: He appears well-developed and  well-nourished. No distress.  HENT:  Head: Normocephalic and atraumatic.  Mouth/Throat:    TTP and dental caries noted to left upper 3rd molar and left lower 3rd molar  Eyes: Conjunctivae are normal. Right eye exhibits no discharge. Left eye exhibits no discharge. No scleral icterus.  Cardiovascular: Intact distal pulses.   Pulmonary/Chest: Effort normal.  Neurological: He is alert. Coordination normal.  Skin: He is not diaphoretic.  Nursing note and vitals reviewed.   ED Course  Procedures (including critical care time) NERVE BLOCK Performed by: Harle Battiestysinger, Lillyanna Glandon Consent: Verbal consent obtained. Required items: required blood products, implants, devices, and special equipment available Time out: Immediately prior to procedure a "time out" was called to verify the correct patient, procedure, equipment, support staff and site/side marked as required.  Indication: dental pain Nerve block body site: inferior alveolar nerve  Preparation: Patient was prepped and draped in the usual sterile fashion. Needle gauge: 27 G Location technique: anatomical landmarks  Local anesthetic: bupivicaine and epi  Anesthetic total: 1.8 ml  Outcome: pain improved Patient tolerance: Patient tolerated the procedure well with no immediate complications.  NERVE BLOCK Performed by: Harle Battiestysinger, Jorie Zee Consent: Verbal consent obtained. Required items: required blood products, implants, devices, and special equipment available Time out: Immediately prior to procedure a "time out" was called to verify the correct patient, procedure, equipment, support staff and site/side marked as required.  Indication: dental pain Nerve block body site: posterior superior nerve  Preparation: Patient was prepped and draped in the usual sterile fashion. Needle gauge: 27 G  Location technique: anatomical landmarks  Local anesthetic: bupivicaine with epi  Anesthetic total: 1.8 ml  Outcome: pain improved Patient  tolerance: Patient tolerated the procedure well with no immediate complications.   Labs Review Labs Reviewed - No data to display  Imaging Review No results found.   EKG Interpretation None      MDM   Final diagnoses:  Pain, dental   24 yo with toothache but no signs of gross abscess or concerning for Ludwig's angina or spread of infection. Pain resolved after dental block.  Prescription for PCN and NSAIDs provided and discussed importance of dental follow-up for definitive treatment. Pt is well-appearing, in no acute distress and vital signs reviewed and not concerning.  He appears safe to be discharged.  Return precautions provided.  Pt aware of plan and in agreement.    Filed Vitals:   02/10/15 0004  BP: 120/74  Pulse: 71  Temp: 98.3 F (36.8 C)  TempSrc: Oral  Resp: 17  SpO2: 99%   Meds given in ED:  Medications  bupivacaine-epinephrine (MARCAINE W/ EPI) 0.5% -1:200000 injection 3.6 mL (3.6 mLs Infiltration Given by Other 02/10/15 0102)  naproxen (NAPROSYN) tablet 500 mg (500 mg Oral Given 02/10/15 0138)    Discharge Medication List as of 02/10/2015  1:11 AM    START taking these medications   Details  naproxen (NAPROSYN) 500 MG tablet Take 1 tablet (500 mg total) by mouth 2 (two) times daily., Starting 02/10/2015, Until Discontinued, Print    penicillin v potassium (VEETID) 500 MG tablet Take 1 tablet (500 mg total) by mouth 4 (four) times daily., Starting 02/10/2015, Until Thu 02/17/15, Print    traMADol (ULTRAM) 50 MG tablet Take 1 tablet (50 mg total) by mouth every 6 (six) hours as needed., Starting 02/10/2015, Until Discontinued, Print           Harle BattiestElizabeth Arlie Posch, NP 02/11/15 [phone]  Marisa Severinlga Otter, MD 02/11/15 [phone]

## 2016-10-07 ENCOUNTER — Emergency Department (HOSPITAL_COMMUNITY)
Admission: EM | Admit: 2016-10-07 | Discharge: 2016-10-07 | Disposition: A | Discharge status: Home / Self Care | Payer: Self-pay | Attending: Emergency Medicine | Admitting: Emergency Medicine

## 2016-10-07 ENCOUNTER — Encounter (HOSPITAL_COMMUNITY): Payer: Self-pay

## 2016-10-07 DIAGNOSIS — K0889 Other specified disorders of teeth and supporting structures: Secondary | ICD-10-CM

## 2016-10-07 DIAGNOSIS — Z79899 Other long term (current) drug therapy: Secondary | ICD-10-CM | POA: Insufficient documentation

## 2016-10-07 DIAGNOSIS — F17210 Nicotine dependence, cigarettes, uncomplicated: Secondary | ICD-10-CM | POA: Insufficient documentation

## 2016-10-07 DIAGNOSIS — K029 Dental caries, unspecified: Secondary | ICD-10-CM

## 2016-10-07 MED ORDER — PENICILLIN V POTASSIUM 500 MG PO TABS: 500.0000 mg | ORAL_TABLET | Freq: Four times a day (QID) | ORAL | 0 refills | Status: AC

## 2016-10-07 MED ORDER — MAGIC MOUTHWASH W/LIDOCAINE: 5.0000 mL | Freq: Four times a day (QID) | ORAL | 0 refills | Status: DC | PRN

## 2016-10-07 MED ORDER — NAPROXEN 500 MG PO TABS: 500.0000 mg | ORAL_TABLET | Freq: Two times a day (BID) | ORAL | 0 refills | Status: DC | PRN

## 2016-10-07 NOTE — ED Provider Notes (Signed)
[facility]-EMERGENCY DEPT Provider Note   CSN: [id number] Arrival date & time: 10/07/16  1626   By signing my name below, I, Clovis PuAvnee Patel, attest that this documentation has been prepared under the direction and in the presence of  [facility] Orvile Corona, PA-C. Electronically Signed: Clovis PuAvnee Patel, ED Scribe. 10/07/16. 5:20 PM.   History   Chief Complaint Chief Complaint  Patient presents with  . Dental Pain   The history is provided by the patient. No language interpreter was used.   HPI Comments:  Darryl Lloyd is a 25 y.o. male who presents to the Emergency Department complaining of sudden onset, constant, moderate, throbbing, left upper dental pain x yesterday. His pain is worse upon palpation. He has taken ibuprofen/advil with no relief. Pt denies fevers, chills, sore throat, trouble swallowing, any other associated symptoms and any other modifying factors at this time. No known drug allergies. Pt is not followed by a dentist because he does not have any insurance. He notes dental decay to other molars.   History reviewed. No pertinent past medical history.  There are no active problems to display for this patient.   History reviewed. No pertinent surgical history.     Home Medications    Prior to Admission medications   Medication Sig Start Date End Date Taking? Authorizing Provider  magic mouthwash w/lidocaine SOLN Take 5 mLs by mouth 4 (four) times daily as needed for mouth pain. Swish and spit 10/07/16   Trixie DredgeEmily Airanna Partin, PA-C  naproxen (NAPROSYN) 500 MG tablet Take 1 tablet (500 mg total) by mouth 2 (two) times daily as needed for mild pain or moderate pain. 10/07/16   Trixie DredgeEmily Mitchael Luckey, PA-C  penicillin v potassium (VEETID) 500 MG tablet Take 1 tablet (500 mg total) by mouth 4 (four) times daily. 10/07/16 10/14/16  Trixie DredgeEmily Zunaira Lamy, PA-C    Family History History reviewed. No pertinent family history.  Social History Social History  Substance Use Topics  . Smoking status: Current Every Day Smoker    Packs/day: 0.50    Types: Cigarettes  . Smokeless tobacco: Never Used  . Alcohol use No     Allergies   Patient has no known allergies.   Review of Systems Review of Systems  Constitutional: Negative for chills and fever.  HENT: Positive for dental problem. Negative for facial swelling, sore throat and trouble swallowing.   Respiratory: Negative for shortness of breath, wheezing and stridor.   Musculoskeletal: Negative for myalgias, neck pain and neck stiffness.  Skin: Negative for color change.  Allergic/Immunologic: Negative for immunocompromised state.  Psychiatric/Behavioral: Negative for self-injury.     Physical Exam Updated Vital Signs BP 117/80 (BP Location: Right Arm)   Pulse 85   Temp 98.1 F (36.7 C) (Oral)   Resp 18   Ht 5\' 8"  (1.727 m)   Wt 125 lb (56.7 kg)   SpO2 100%   BMI 19.01 kg/m   Physical Exam  Constitutional: He appears well-developed and well-nourished. No distress.  HENT:  Head: Normocephalic and atraumatic.  Mouth/Throat: Uvula is midline and oropharynx is clear and moist. Mucous membranes are not dry. No uvula swelling. No oropharyngeal exudate, posterior oropharyngeal edema, posterior oropharyngeal erythema or tonsillar abscesses.  Severe decay of left upper and left lower final molars. Tenderness to percussion of both. No facial swelling   Neck: Normal range of motion. Neck supple.  Cardiovascular: Normal rate.   Pulmonary/Chest: Effort normal and breath sounds normal. No stridor.  Lymphadenopathy:    He has no cervical adenopathy.  Neurological: He is alert.  Skin: He is not diaphoretic.  Nursing note and vitals reviewed.    ED Treatments / Results  DIAGNOSTIC STUDIES:  Oxygen Saturation is 100% on RA, normal by my interpretation.    COORDINATION OF CARE:  5:18 PM Discussed treatment plan with pt at bedside and pt agreed to plan.  Labs (all labs ordered are listed, but only abnormal results are displayed) Labs Reviewed - No  data to display  EKG  EKG Interpretation None       Radiology No results found.  Procedures Procedures (including critical care time)  Medications Ordered in ED Medications - No data to display   Initial Impression / Assessment and Plan / ED Course  I have reviewed the triage vital signs and the nursing notes.  Pertinent labs & imaging results that were available during my care of the patient were reviewed by me and considered in my medical decision making (see chart for details).  Clinical Course     Patient with dentalgia.  No abscess requiring immediate incision and drainage.  Exam not concerning for Ludwig's angina or pharyngeal abscess.  Will treat with penicillin, naproxen and magic mouthwash. Pt instructed to follow-up with dentist.  Discussed return precautions. Pt safe for discharge.  Discussed result, findings, treatment, and follow up  with patient.  Pt given return precautions.  Pt verbalizes understanding and agrees with plan.      Final Clinical Impressions(s) / ED Diagnoses   Final diagnoses:  Pain, dental  Dental caries    New Prescriptions New Prescriptions   MAGIC MOUTHWASH W/LIDOCAINE SOLN    Take 5 mLs by mouth 4 (four) times daily as needed for mouth pain. Swish and spit   NAPROXEN (NAPROSYN) 500 MG TABLET    Take 1 tablet (500 mg total) by mouth 2 (two) times daily as needed for mild pain or moderate pain.   PENICILLIN V POTASSIUM (VEETID) 500 MG TABLET    Take 1 tablet (500 mg total) by mouth 4 (four) times daily.    I personally performed the services described in this documentation, which was scribed in my presence. The recorded information has been reviewed and is accurate.    Trixie Dredgemily Mirella Gueye, PA-C 10/07/16 1754    Arby BarretteMarcy Pfeiffer, MD 10/10/16 [phone]

## 2016-10-07 NOTE — ED Triage Notes (Signed)
PT C/O WISDOM TOOTH PAIN. PT STS ALL 4 OF HIS WISDOM TEETH ARE DECAYED, AND HE HAS BEEN HAVING THE PAIN FOR A "WHILE", BUT THE LEFT TOOTH IS HURTING WORSE TODAY. DENIES FEVER.

## 2016-10-07 NOTE — Discharge Instructions (Signed)
Read the information below.  Use the prescribed medication as directed.  Please discuss all new medications with your pharmacist.  You may return to the Emergency Department at any time for worsening condition or any new symptoms that concern you.   Please call the dentist listed above within 48 hours to schedule a close follow up appointment.  If you develop fevers, swelling in your face, difficulty swallowing or breathing, return to the ER immediately for a recheck.   °

## 2017-03-01 ENCOUNTER — Emergency Department (HOSPITAL_COMMUNITY)
Admission: EM | Admit: 2017-03-01 | Discharge: 2017-03-01 | Disposition: A | Discharge status: Home / Self Care | Payer: Self-pay | Attending: Emergency Medicine | Admitting: Emergency Medicine

## 2017-03-01 ENCOUNTER — Encounter (HOSPITAL_COMMUNITY): Payer: Self-pay | Admitting: Emergency Medicine

## 2017-03-01 DIAGNOSIS — B009 Herpesviral infection, unspecified: Secondary | ICD-10-CM | POA: Insufficient documentation

## 2017-03-01 DIAGNOSIS — F17210 Nicotine dependence, cigarettes, uncomplicated: Secondary | ICD-10-CM | POA: Insufficient documentation

## 2017-03-01 MED ORDER — ACYCLOVIR 400 MG PO TABS: 400.0000 mg | ORAL_TABLET | Freq: Three times a day (TID) | ORAL | 0 refills | Status: DC

## 2017-03-01 NOTE — ED Provider Notes (Signed)
[facility] DEPT Provider Note    By signing my name below, I, Earmon PhoenixJennifer Waddell, attest that this documentation has been prepared under the direction and in the presence of [employer] IndustriesJeff Jasmond River, PA-C. Electronically Signed: Earmon PhoenixJennifer Waddell, ED Scribe. 03/01/17. 4:12 PM.    History   Chief Complaint Chief Complaint  Patient presents with  . penial rash   The history is provided by the patient and medical records. No language interpreter was used.    Darryl SandyJose Morey is a 26 y.o. male who presents to the Emergency Department complaining of irritation to the foreskin of his uncircumcised penis that has been present for the past 2-3 days. He reports associated pain of the area. He has cleansed the area in peroxide and alcohol with no relief. Anything that touches the area increases the pain. He denies alleviating factors. He denies fever, chills, penile discharge, scrotal swelling or pain, urinary symptoms. He states he is currently sexually active with a male.   History reviewed. No pertinent past medical history.  There are no active problems to display for this patient.   History reviewed. No pertinent surgical history.     Home Medications    Prior to Admission medications   Medication Sig Start Date End Date Taking? Authorizing Provider  acyclovir (ZOVIRAX) 400 MG tablet Take 1 tablet (400 mg total) by mouth 3 (three) times daily. 03/01/17   Ajla Mcgeachy, Tinnie GensJeffrey, PA-C  magic mouthwash w/lidocaine SOLN Take 5 mLs by mouth 4 (four) times daily as needed for mouth pain. Swish and spit 10/07/16   West, Emily, PA-C  naproxen (NAPROSYN) 500 MG tablet Take 1 tablet (500 mg total) by mouth 2 (two) times daily as needed for mild pain or moderate pain. 10/07/16   Trixie DredgeWest, Emily, PA-C    Family History No family history on file.  Social History Social History  Substance Use Topics  . Smoking status: Current Every Day Smoker    Packs/day: 0.50    Types: Cigarettes  . Smokeless tobacco: Never  Used  . Alcohol use No     Allergies   Patient has no known allergies.   Review of Systems Review of Systems  Constitutional: Negative for chills and fever.  Genitourinary: Positive for genital sores. Negative for difficulty urinating, discharge, dysuria, scrotal swelling and testicular pain.     Physical Exam Updated Vital Signs BP 132/85 (BP Location: Right Arm)   Pulse (!) 56   Temp 98.6 F (37 C) (Oral)   Resp 16   SpO2 99%   Physical Exam  Constitutional: He is oriented to person, place, and time. He appears well-developed and well-nourished.  HENT:  Head: Normocephalic and atraumatic.  Neck: Normal range of motion.  Cardiovascular: Normal rate.   Pulmonary/Chest: Effort normal.  Genitourinary: Uncircumcised.  Genitourinary Comments: Ruptured vesicles along internal portion of foreskin. No penile discharge.  Musculoskeletal: Normal range of motion.  Neurological: He is alert and oriented to person, place, and time.  Skin: Skin is warm and dry.  Psychiatric: He has a normal mood and affect. His behavior is normal.  Nursing note and vitals reviewed.    ED Treatments / Results  DIAGNOSTIC STUDIES: Oxygen Saturation is 99% on RA, normal by my interpretation.   COORDINATION OF CARE: 4:11 PM- Will prescribe Acyclovir. Pt verbalizes understanding and agrees to plan.  Medications - No data to display  Labs (all labs ordered are listed, but only abnormal results are displayed) Labs Reviewed  HSV CULTURE AND TYPING    EKG  EKG Interpretation None       Radiology No results found.  Procedures Procedures (including critical care time)  Medications Ordered in ED Medications - No data to display   Initial Impression / Assessment and Plan / ED Course  I have reviewed the triage vital signs and the nursing notes.  Pertinent labs & imaging results that were available during my care of the patient were reviewed by me and considered in my medical  decision making (see chart for details).      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 Patient has been using alcohol and peroxide which likely cause worsening of symptoms. Patient will be treated for HSV, swab sent for culture and typing. Patient will follow-up with the health department if reoccurrence presents, return precautions given. Patient verbalizes understanding and agreement to today's plan. He will follow with his results on my chart and inform any sexual partners of his diagnosis.   I personally performed the services described in this documentation, which was scribed in my presence. The recorded information has been reviewed and is accurate.      Eyvonne Mechanic, PA-C 03/01/17 1650    Gwyneth Sprout, MD 03/01/17 [phone]

## 2017-03-01 NOTE — Discharge Instructions (Signed)
°  Please read attached information. If you experience any new or worsening signs or symptoms please return to the emergency room for evaluation. Please follow-up with your primary care provider or health department as discussed. Please use medication prescribed only as directed and discontinue taking if you have any concerning signs or symptoms.

## 2017-03-01 NOTE — ED Triage Notes (Signed)
Pt reports for 2-3 days he has rash on tip of penis. Patient states that he put alcohol and peroxide on it today. Patient reports that he is uncircumcised and sometimes has pain with urination and sometimes dont depending if skin is pulled back or not. Patient denies any drainage.

## 2017-03-04 LAB — HSV CULTURE AND TYPING

## 2017-06-03 ENCOUNTER — Emergency Department (HOSPITAL_COMMUNITY)
Admission: EM | Admit: 2017-06-03 | Discharge: 2017-06-04 | Disposition: A | Discharge status: Home / Self Care | Payer: Self-pay | Attending: Emergency Medicine | Admitting: Emergency Medicine

## 2017-06-03 ENCOUNTER — Encounter (HOSPITAL_COMMUNITY): Payer: Self-pay

## 2017-06-03 DIAGNOSIS — R300 Dysuria: Secondary | ICD-10-CM | POA: Insufficient documentation

## 2017-06-03 DIAGNOSIS — J020 Streptococcal pharyngitis: Secondary | ICD-10-CM | POA: Insufficient documentation

## 2017-06-03 LAB — URINALYSIS, ROUTINE W REFLEX MICROSCOPIC
Bilirubin Urine: NEGATIVE
GLUCOSE, UA: NEGATIVE mg/dL
Ketones, ur: NEGATIVE mg/dL
NITRITE: NEGATIVE
Protein, ur: NEGATIVE mg/dL
SPECIFIC GRAVITY, URINE: 1.02 (ref 1.005–1.030)
Squamous Epithelial / LPF: NONE SEEN
pH: 6 (ref 5.0–8.0)

## 2017-06-03 LAB — RAPID STREP SCREEN (MED CTR MEBANE ONLY): Streptococcus, Group A Screen (Direct): POSITIVE — AB

## 2017-06-03 MED ORDER — PENICILLIN G BENZATHINE 1200000 UNIT/2ML IM SUSP
1.2000 10*6.[IU] | Freq: Once | INTRAMUSCULAR | Status: AC
  Administered 2017-06-03: 1.2 10*6.[IU] via INTRAMUSCULAR
  Filled 2017-06-03: qty 2

## 2017-06-03 NOTE — ED Provider Notes (Signed)
[facility] Provider Note   CSN: [id number] Arrival date & time: 06/03/17  2003     History   Chief Complaint Chief Complaint  Patient presents with  . Dysuria  . Sore Throat    HPI Darryl Lloyd is a 26 y.o. male who presents to the ED with sore throat and dysuria. Patient reports that he started sore throat fever and chills yesterday and also burning with urination. He reports being sexually active with male partners. Patient reports that he has been to the ED once in the past for burning with urination and at that time had a lesion on his penis that they thought could be herpes.   The history is provided by the patient. No language interpreter was used.  Dysuria   This is a new problem. The problem occurs every urination. The problem has been gradually worsening. The quality of the pain is described as burning. The maximum temperature recorded prior to his arrival was 100 to 100.9 F. He is sexually active. Associated symptoms include chills, sweats, nausea and frequency. Pertinent negatives include no discharge. He has tried nothing for the symptoms.  Sore Throat  The current episode started 2 days ago. The problem occurs constantly. The problem has been gradually worsening. Pertinent negatives include no chest pain and no headaches.    History reviewed. No pertinent past medical history.  There are no active problems to display for this patient.   History reviewed. No pertinent surgical history.     Home Medications    Prior to Admission medications   Medication Sig Start Date End Date Taking? Authorizing Provider  acyclovir (ZOVIRAX) 400 MG tablet Take 1 tablet (400 mg total) by mouth 3 (three) times daily. 03/01/17   Hedges, Tinnie Gens, PA-C  magic mouthwash w/lidocaine SOLN Take 5 mLs by mouth 4 (four) times daily as needed for mouth pain. Swish and spit 10/07/16   West, Emily, PA-C  metroNIDAZOLE (FLAGYL) 500 MG tablet Take 1 tablet (500 mg total) by mouth 2  (two) times daily. 06/04/17   Janne Napoleon, NP  naproxen (NAPROSYN) 500 MG tablet Take 1 tablet (500 mg total) by mouth 2 (two) times daily as needed for mild pain or moderate pain. 10/07/16   Trixie Dredge, PA-C    Family History No family history on file.  Social History Social History  Substance Use Topics  . Smoking status: Current Every Day Smoker    Packs/day: 0.50    Types: Cigarettes  . Smokeless tobacco: Never Used  . Alcohol use No     Allergies   Patient has no known allergies.   Review of Systems Review of Systems  Constitutional: Positive for chills.  HENT: Positive for sore throat.   Respiratory: Negative for cough.   Cardiovascular: Negative for chest pain.  Gastrointestinal: Positive for nausea.  Genitourinary: Positive for dysuria and frequency. Negative for discharge.  Musculoskeletal: Negative for gait problem.  Skin: Negative for rash.  Neurological: Negative for headaches.  Hematological: Positive for adenopathy.  Psychiatric/Behavioral: The patient is not nervous/anxious.      Physical Exam Updated Vital Signs BP 125/84 (BP Location: Left Arm)   Pulse 78   Temp 99.6 F (37.6 C) (Oral)   Resp 18   Ht 5\' 10"  (1.778 m)   Wt 59 kg (130 lb)   SpO2 100%   BMI 18.65 kg/m   Physical Exam  Constitutional: He is oriented to person, place, and time. He appears well-developed and well-nourished. No distress.  HENT:  Head: Normocephalic.  Right Ear: Tympanic membrane normal.  Left Ear: Tympanic membrane normal.  Nose: Nose normal.  Mouth/Throat: Uvula is midline and mucous membranes are normal. Posterior oropharyngeal erythema present. No oropharyngeal exudate or posterior oropharyngeal edema.  Eyes: EOM are normal.  Neck: Neck supple.  Cardiovascular: Normal rate.   Pulmonary/Chest: Effort normal.  Abdominal: Soft. There is no tenderness.  Genitourinary: Uncircumcised. Penile erythema and penile tenderness present. Discharge found.      Genitourinary Comments: There is erythema of the meatus and tenderness.   Musculoskeletal: Normal range of motion.  Lymphadenopathy: Inguinal adenopathy noted on the left side.  Neurological: He is alert and oriented to person, place, and time. No cranial nerve deficit.  Skin: Skin is warm and dry.  Psychiatric: He has a normal mood and affect. His behavior is normal.  Nursing note and vitals reviewed.    ED Treatments / Results  Labs (all labs ordered are listed, but only abnormal results are displayed) Labs Reviewed  RAPID STREP SCREEN (NOT AT [facility]) - Abnormal; Notable for the following:       Result Value   Streptococcus, Group A Screen (Direct) POSITIVE (*)    All other components within normal limits  URINALYSIS, ROUTINE W REFLEX MICROSCOPIC - Abnormal; Notable for the following:    Hgb urine dipstick SMALL (*)    Leukocytes, UA TRACE (*)    Bacteria, UA RARE (*)    All other components within normal limits  HSV CULTURE AND TYPING  GC/CHLAMYDIA PROBE AMP ([place]) NOT AT [facility]   Radiology No results found.  Procedures Procedures (including critical care time)  Medications Ordered in ED Medications  azithromycin (ZITHROMAX) tablet 1,000 mg (not administered)  penicillin g benzathine (BICILLIN LA) 1200000 UNIT/2ML injection 1.2 Million Units (1.2 Million Units Intramuscular Given 06/03/17 2319)     Initial Impression / Assessment and Plan / ED Course  I have reviewed the triage vital signs and the nursing notes.  Pt febrile with tonsillar exudate, cervical lymphadenopathy, & dysphagia; diagnosis of bacterial pharyngitis. Treated in the ED with PCN IM.  Pt appears mildly dehydrated, discussed importance of water rehydration. Presentation non concerning for PTA or RPA. No trismus or uvula deviation. Specific return precautions discussed. Pt able to drink water in ED without difficulty with intact air way. Recommended PCP follow up.   Pt presents with concerns  for possible STD.  Pt understands that they have GC/Chlamydia cultures pending and that they will need to inform all sexual partners if results return positive. Pt has been treated prophylactically with azithromycin. Rx for Flagyl. Pt has been advised to not drink alcohol while on this medication.  Patient to be discharged with instructions to follow up with OBGYN/PCP. Discussed importance of using protection when sexually active. F/u with GC HD. Final Clinical Impressions(s) / ED Diagnoses   Final diagnoses:  Strep pharyngitis  Dysuria    New Prescriptions New Prescriptions   METRONIDAZOLE (FLAGYL) 500 MG TABLET    Take 1 tablet (500 mg total) by mouth 2 (two) times daily.     Kerrie Buffalo [place], [place] 06/04/17 8937    Rolan Bucco, MD 06/09/17 1208

## 2017-06-03 NOTE — ED Notes (Signed)
Called for triage no answer  

## 2017-06-03 NOTE — ED Triage Notes (Signed)
Patient states he has been having "cold and hot flashes" yesterday. Patient states he developed severe sharp pain with urination yesterday. Patient states he has been having a sore throat since [facility] today.

## 2017-06-03 NOTE — ED Notes (Signed)
Mardene Celeste, RN chaperoned Hope, NP with a penis exam.

## 2017-06-04 LAB — GC/CHLAMYDIA PROBE AMP (~~LOC~~) NOT AT ARMC
CHLAMYDIA, DNA PROBE: POSITIVE — AB
NEISSERIA GONORRHEA: NEGATIVE

## 2017-06-04 MED ORDER — METRONIDAZOLE 500 MG PO TABS: 500.0000 mg | ORAL_TABLET | Freq: Two times a day (BID) | ORAL | 0 refills | Status: DC

## 2017-06-04 MED ORDER — AZITHROMYCIN 250 MG PO TABS
1000.0000 mg | ORAL_TABLET | Freq: Once | ORAL | Status: AC
  Administered 2017-06-04: 1000 mg via ORAL
  Filled 2017-06-04: qty 4

## 2017-06-04 NOTE — Discharge Instructions (Signed)
We have treated you with an injection of penicillin for your strep throat. We have covered you for possible Chlamydia with the Zithromax we gave you in the ED. You will need to fill the prescription for Flagyl. Follow up with the health department for additional screening.

## 2017-06-06 LAB — HSV CULTURE AND TYPING

## 2018-02-03 ENCOUNTER — Other Ambulatory Visit: Payer: Self-pay

## 2018-02-03 ENCOUNTER — Encounter (HOSPITAL_COMMUNITY): Payer: Self-pay

## 2018-02-03 ENCOUNTER — Emergency Department (HOSPITAL_COMMUNITY)
Admission: EM | Admit: 2018-02-03 | Discharge: 2018-02-03 | Disposition: A | Discharge status: Home / Self Care | Payer: Self-pay | Attending: Emergency Medicine | Admitting: Emergency Medicine

## 2018-02-03 DIAGNOSIS — R197 Diarrhea, unspecified: Secondary | ICD-10-CM | POA: Insufficient documentation

## 2018-02-03 DIAGNOSIS — Z79899 Other long term (current) drug therapy: Secondary | ICD-10-CM | POA: Insufficient documentation

## 2018-02-03 DIAGNOSIS — R112 Nausea with vomiting, unspecified: Secondary | ICD-10-CM | POA: Insufficient documentation

## 2018-02-03 DIAGNOSIS — F17210 Nicotine dependence, cigarettes, uncomplicated: Secondary | ICD-10-CM | POA: Insufficient documentation

## 2018-02-03 HISTORY — DX: Unspecified hemorrhoids: K64.9

## 2018-02-03 LAB — CBC
HCT: 42.9 % (ref 39.0–52.0)
Hemoglobin: 14.2 g/dL (ref 13.0–17.0)
MCH: 30.7 pg (ref 26.0–34.0)
MCHC: 33.1 g/dL (ref 30.0–36.0)
MCV: 92.7 fL (ref 78.0–100.0)
Platelets: 354 10*3/uL (ref 150–400)
RBC: 4.63 MIL/uL (ref 4.22–5.81)
RDW: 13 % (ref 11.5–15.5)
WBC: 4.7 10*3/uL (ref 4.0–10.5)

## 2018-02-03 LAB — COMPREHENSIVE METABOLIC PANEL
ALT: 31 U/L (ref 17–63)
AST: 29 U/L (ref 15–41)
Albumin: 4.4 g/dL (ref 3.5–5.0)
Alkaline Phosphatase: 71 U/L (ref 38–126)
Anion gap: 9 (ref 5–15)
BUN: 12 mg/dL (ref 6–20)
CHLORIDE: 106 mmol/L (ref 101–111)
CO2: 25 mmol/L (ref 22–32)
Calcium: 9.2 mg/dL (ref 8.9–10.3)
Creatinine, Ser: 0.84 mg/dL (ref 0.61–1.24)
Glucose, Bld: 77 mg/dL (ref 65–99)
POTASSIUM: 4.4 mmol/L (ref 3.5–5.1)
Sodium: 140 mmol/L (ref 135–145)
Total Bilirubin: 0.6 mg/dL (ref 0.3–1.2)
Total Protein: 8 g/dL (ref 6.5–8.1)

## 2018-02-03 LAB — URINALYSIS, ROUTINE W REFLEX MICROSCOPIC
Bilirubin Urine: NEGATIVE
GLUCOSE, UA: NEGATIVE mg/dL
Hgb urine dipstick: NEGATIVE
Ketones, ur: NEGATIVE mg/dL
LEUKOCYTES UA: NEGATIVE
Nitrite: NEGATIVE
PH: 7 (ref 5.0–8.0)
Protein, ur: NEGATIVE mg/dL
Specific Gravity, Urine: 1.004 — ABNORMAL LOW (ref 1.005–1.030)

## 2018-02-03 LAB — LIPASE, BLOOD: LIPASE: 33 U/L (ref 11–51)

## 2018-02-03 MED ORDER — LOPERAMIDE HCL 2 MG PO CAPS: 2.0000 mg | ORAL_CAPSULE | Freq: Four times a day (QID) | ORAL | 0 refills | Status: DC | PRN

## 2018-02-03 MED ORDER — ONDANSETRON 4 MG PO TBDP
4.0000 mg | ORAL_TABLET | Freq: Once | ORAL | Status: AC | PRN
  Administered 2018-02-03: 4 mg via ORAL
  Filled 2018-02-03: qty 1

## 2018-02-03 MED ORDER — ONDANSETRON HCL 4 MG PO TABS: 4.0000 mg | ORAL_TABLET | Freq: Four times a day (QID) | ORAL | 0 refills | Status: DC

## 2018-02-03 MED ORDER — LOPERAMIDE HCL 2 MG PO CAPS
4.0000 mg | ORAL_CAPSULE | Freq: Once | ORAL | Status: AC
  Administered 2018-02-03: 4 mg via ORAL
  Filled 2018-02-03: qty 2

## 2018-02-03 NOTE — ED Triage Notes (Signed)
Patient c/o mid abdominal pain, vomiting, and diarrhea since waking this AM.

## 2018-02-03 NOTE — ED Provider Notes (Signed)
[facility]-EMERGENCY DEPT Provider Note   CSN: [id number] Arrival date & time: 02/03/18  0757     History   Chief Complaint Chief Complaint  Patient presents with  . Abdominal Pain  . Emesis  . Diarrhea    HPI Darryl Lloyd is a 27 y.o. male with a history of external hemorrhoids who presents to the emergency department with a chief complaint of nausea, vomiting, diarrhea.  The patient endorses sudden onset nonbilious, non-bloody emesis x3 and countless episodes of diarrhea since he awoke this morning.  Last episode of emesis was at 10:50. He was given Zofran in the waiting room and has been able to tolerate a bag of chips and a soda without additional episodes of emesis.  He also endorses mild diffuse abdominal pain that is worse when he feels an episode of emesis or diarrhea coming on.  He denies fever, chills, dysuria, penile or testicular pain or swelling, hematemesis, hematochezia, melena, flank pain, back pain, weakness, dizziness, lightheadedness, headache, dyspnea, or chest pain.  Pain is improved when he is not having emesis or diarrhea.  No known sick contacts.  He works at [employer] as a Psychologist, educational.   The history is provided by the patient. No language interpreter was used.    Past Medical History:  Diagnosis Date  . Hemorrhoids     There are no active problems to display for this patient.   History reviewed. No pertinent surgical history.      Home Medications    Prior to Admission medications   Medication Sig Start Date End Date Taking? Authorizing Provider  acyclovir (ZOVIRAX) 400 MG tablet Take 1 tablet (400 mg total) by mouth 3 (three) times daily. Patient not taking: Reported on 02/03/2018 03/01/17   Eyvonne Mechanic, PA-C  loperamide (IMODIUM) 2 MG capsule Take 1 capsule (2 mg total) by mouth 4 (four) times daily as needed for diarrhea or loose stools. 02/03/18   McDonald, Mia A, PA-C  magic mouthwash w/lidocaine SOLN Take 5 mLs by  mouth 4 (four) times daily as needed for mouth pain. Swish and spit Patient not taking: Reported on 02/03/2018 10/07/16   Trixie Dredge, PA-C  metroNIDAZOLE (FLAGYL) 500 MG tablet Take 1 tablet (500 mg total) by mouth 2 (two) times daily. Patient not taking: Reported on 02/03/2018 06/04/17   Janne Napoleon, NP  naproxen (NAPROSYN) 500 MG tablet Take 1 tablet (500 mg total) by mouth 2 (two) times daily as needed for mild pain or moderate pain. Patient not taking: Reported on 02/03/2018 10/07/16   Trixie Dredge, PA-C  ondansetron (ZOFRAN) 4 MG tablet Take 1 tablet (4 mg total) by mouth every 6 (six) hours. 02/03/18   McDonald, Coral Else, PA-C    Family History Family History  Family history unknown: Yes    Social History Social History   Tobacco Use  . Smoking status: Current Every Day Smoker    Packs/day: 1.00    Types: Cigarettes  . Smokeless tobacco: Never Used  Substance Use Topics  . Alcohol use: No  . Drug use: Yes    Types: Marijuana     Allergies   Patient has no known allergies.   Review of Systems Review of Systems  Constitutional: Negative for appetite change, chills and fever.  Respiratory: Negative for shortness of breath.   Cardiovascular: Negative for chest pain.  Gastrointestinal: Positive for abdominal pain, diarrhea, nausea and vomiting. Negative for abdominal distention, anal bleeding, blood in stool and constipation.  Genitourinary: Negative  for discharge, dysuria, frequency, penile pain, scrotal swelling, testicular pain and urgency.  Musculoskeletal: Negative for back pain.  Skin: Negative for rash.  Allergic/Immunologic: Negative for immunocompromised state.  Neurological: Negative for dizziness, weakness, numbness and headaches.  Psychiatric/Behavioral: Negative for confusion.     Physical Exam Updated Vital Signs BP 98/66 (BP Location: Right Arm)   Pulse (!) 58   Temp 98.3 F (36.8 C) (Oral)   Resp 18   Ht  (1.727 m)   Wt 61.2 kg (135 lb)   SpO2  99%   BMI 20.53 kg/m   Physical Exam  Constitutional: He is oriented to person, place, and time. He appears well-developed.  Well-appearing, no acute distress.  HENT:  Head: Normocephalic.  Eyes: Conjunctivae are normal.  Neck: Neck supple.  Cardiovascular: Normal rate, regular rhythm, normal heart sounds and intact distal pulses. Exam reveals no gallop and no friction rub.  No murmur heard. Pulmonary/Chest: Effort normal. No stridor. No respiratory distress. He has no wheezes. He has no rales. He exhibits no tenderness.  Abdominal: Soft. Bowel sounds are normal. He exhibits no distension and no mass. There is no tenderness. There is no rebound and no guarding. No hernia.  Upper active bowel sounds in all 4 quadrants.  Abdomen is soft, nondistended.  No reproducible tenderness to palpation.  No peritoneal signs.  Negative Murphy sign.  No tenderness over McBurney's point.  No CVA tenderness bilaterally.  Musculoskeletal: Normal range of motion. He exhibits no edema, tenderness or deformity.  Neurological: He is alert and oriented to person, place, and time.  Skin: Skin is warm and dry. Capillary refill takes less than 2 seconds.  Psychiatric: His behavior is normal.  Nursing note and vitals reviewed.    ED Treatments / Results  Labs (all labs ordered are listed, but only abnormal results are displayed) Labs Reviewed  URINALYSIS, ROUTINE W REFLEX MICROSCOPIC - Abnormal; Notable for the following components:      Result Value   Color, Urine COLORLESS (*)    Specific Gravity, Urine 1.004 (*)    All other components within normal limits  LIPASE, BLOOD  COMPREHENSIVE METABOLIC PANEL  CBC    EKG None  Radiology No results found.  Procedures Procedures (including critical care time)  Medications Ordered in ED Medications  loperamide (IMODIUM) capsule 4 mg (has no administration in time range)  ondansetron (ZOFRAN-ODT) disintegrating tablet 4 mg (4 mg Oral Given 02/03/18  0843)     Initial Impression / Assessment and Plan / ED Course  I have reviewed the triage vital signs and the nursing notes.  Pertinent labs & imaging results that were available during my care of the patient were reviewed by me and considered in my medical decision making (see chart for details).     27 year old male presenting with nausea, vomiting, diarrhea, and abdominal pain, onset when he awoke this morning.  On physical exam, abdomen is soft, nontender, nondistended.  He has tolerated a Coca-Cola and bag of chips in the waiting room after receiving oral Zofran.  Labs are reassuring.  Doubt appendicitis, cholecystitis, pancreatitis, diverticulitis, or other intra-abdominal infection. Suspect viral etiology.  Shared decision-making with the patient who feels comfortable being discharged with Zofran and and return if symptoms worsen.  Symptomatic treatment given.  He is hemodynamically stable and in no acute distress.  He is safe for discharge home with outpatient follow-up.  Final Clinical Impressions(s) / ED Diagnoses   Final diagnoses:  Nausea vomiting and diarrhea  ED Discharge Orders        Ordered    ondansetron (ZOFRAN) 4 MG tablet  Every 6 hours     02/03/18 1235    loperamide (IMODIUM) 2 MG capsule  4 times daily PRN     02/03/18 1235       McDonald, Mia A, PA-C 02/03/18 1236    Arby Barrette, MD 02/07/18 1425

## 2018-02-03 NOTE — Discharge Instructions (Signed)
Swallow one tablet of Zofran every 6 hours as needed for nausea and vomiting.  Take 1 tablet of liver might every 6 hours as needed for diarrhea.  You have been given 1 dose of both of these medications in the ED.  It is important that you stay hydrated by drinking water, Pedialyte, Gatorade.   Abdominal (belly) pain can be caused by many things. Your caregiver performed an examination and possibly ordered blood/urine tests and imaging (CT scan, x-rays, ultrasound). Many cases can be observed and treated at home after initial evaluation in the emergency department. Even though you are being discharged home, abdominal pain can be unpredictable. Therefore, you need a repeated exam if your pain does not resolve, returns, or worsens. Most patients with abdominal pain don't have to be admitted to the hospital or have surgery, but serious problems like appendicitis and gallbladder attacks can start out as nonspecific pain. Many abdominal conditions cannot be diagnosed in one visit, so follow-up evaluations are very important. SEEK IMMEDIATE MEDICAL ATTENTION IF: The pain does not go away or becomes severe.  A temperature above 101 develops.  Repeated vomiting occurs (multiple episodes).  The pain becomes localized to portions of the abdomen. The right side could possibly be appendicitis. In an adult, the left lower portion of the abdomen could be colitis or diverticulitis.  Blood is being passed in stools or vomit (bright red or black tarry stools).  Return also if you develop chest pain, difficulty breathing, dizziness or fainting, or become confused, poorly responsive, or inconsolable (young children).

## 2018-02-25 ENCOUNTER — Emergency Department (HOSPITAL_COMMUNITY)
Admission: EM | Admit: 2018-02-25 | Discharge: 2018-02-25 | Disposition: A | Discharge status: Home / Self Care | Payer: Self-pay | Attending: Emergency Medicine | Admitting: Emergency Medicine

## 2018-02-25 ENCOUNTER — Encounter (HOSPITAL_COMMUNITY): Payer: Self-pay | Admitting: Emergency Medicine

## 2018-02-25 ENCOUNTER — Other Ambulatory Visit: Payer: Self-pay

## 2018-02-25 ENCOUNTER — Emergency Department (HOSPITAL_COMMUNITY): Payer: Self-pay

## 2018-02-25 DIAGNOSIS — S61217A Laceration without foreign body of left little finger without damage to nail, initial encounter: Secondary | ICD-10-CM | POA: Insufficient documentation

## 2018-02-25 DIAGNOSIS — F17210 Nicotine dependence, cigarettes, uncomplicated: Secondary | ICD-10-CM | POA: Insufficient documentation

## 2018-02-25 DIAGNOSIS — W260XXA Contact with knife, initial encounter: Secondary | ICD-10-CM | POA: Insufficient documentation

## 2018-02-25 DIAGNOSIS — Y999 Unspecified external cause status: Secondary | ICD-10-CM | POA: Insufficient documentation

## 2018-02-25 DIAGNOSIS — Y939 Activity, unspecified: Secondary | ICD-10-CM | POA: Insufficient documentation

## 2018-02-25 DIAGNOSIS — Y929 Unspecified place or not applicable: Secondary | ICD-10-CM | POA: Insufficient documentation

## 2018-02-25 DIAGNOSIS — Z79899 Other long term (current) drug therapy: Secondary | ICD-10-CM | POA: Insufficient documentation

## 2018-02-25 MED ORDER — LIDOCAINE HCL (PF) 1 % IJ SOLN
10.0000 mL | Freq: Once | INTRAMUSCULAR | Status: AC
  Administered 2018-02-25: 10 mL
  Filled 2018-02-25: qty 30

## 2018-02-25 NOTE — ED Notes (Signed)
Left little finger wound clean and suture tray at bedside.

## 2018-02-25 NOTE — ED Provider Notes (Signed)
[facility]-EMERGENCY DEPT Provider Note   CSN: [id number] Arrival date & time: 02/25/18  1905     History   Chief Complaint Chief Complaint  Patient presents with  . Extremity Laceration    HPI Darryl Lloyd is a 27 y.o. male presenting to the ED with laceration to left fifth digit that occurred prior to arrival.  Patient states he was cutting a piece of paper with a knife and the knife slipped, cutting his finger.  Reports minimal pain associated.  No medications tried prior to arrival.  Denies difficulty with range of motion, numbness, or other complaints.  States his tetanus is up to date.  No history of immunocompromise. The history is provided by the patient.    Past Medical History:  Diagnosis Date  . Hemorrhoids     There are no active problems to display for this patient.   History reviewed. No pertinent surgical history.      Home Medications    Prior to Admission medications   Medication Sig Start Date End Date Taking? Authorizing Provider  acyclovir (ZOVIRAX) 400 MG tablet Take 1 tablet (400 mg total) by mouth 3 (three) times daily. Patient not taking: Reported on 02/03/2018 03/01/17   Eyvonne Mechanic, PA-C  loperamide (IMODIUM) 2 MG capsule Take 1 capsule (2 mg total) by mouth 4 (four) times daily as needed for diarrhea or loose stools. 02/03/18   McDonald, Mia A, PA-C  magic mouthwash w/lidocaine SOLN Take 5 mLs by mouth 4 (four) times daily as needed for mouth pain. Swish and spit Patient not taking: Reported on 02/03/2018 10/07/16   Trixie Dredge, PA-C  metroNIDAZOLE (FLAGYL) 500 MG tablet Take 1 tablet (500 mg total) by mouth 2 (two) times daily. Patient not taking: Reported on 02/03/2018 06/04/17   Janne Napoleon, NP  naproxen (NAPROSYN) 500 MG tablet Take 1 tablet (500 mg total) by mouth 2 (two) times daily as needed for mild pain or moderate pain. Patient not taking: Reported on 02/03/2018 10/07/16   Trixie Dredge, PA-C  ondansetron (ZOFRAN)  4 MG tablet Take 1 tablet (4 mg total) by mouth every 6 (six) hours. 02/03/18   McDonald, Coral Else, PA-C    Family History Family History  Family history unknown: Yes    Social History Social History   Tobacco Use  . Smoking status: Current Every Day Smoker    Packs/day: 1.00    Types: Cigarettes  . Smokeless tobacco: Never Used  Substance Use Topics  . Alcohol use: No  . Drug use: Not Currently     Allergies   Patient has no known allergies.   Review of Systems Review of Systems  Skin: Positive for wound.  Allergic/Immunologic: Negative for immunocompromised state.  Neurological: Negative for numbness.     Physical Exam Updated Vital Signs BP 119/80 (BP Location: Right Arm)   Pulse (!) 59   Temp 98.2 F (36.8 C) (Oral)   Resp 18   Wt 61.2 kg (135 lb)   SpO2 100%   BMI 20.53 kg/m   Physical Exam  Constitutional: He appears well-developed and well-nourished. No distress.  HENT:  Head: Normocephalic and atraumatic.  Eyes: Conjunctivae are normal.  Cardiovascular: Intact distal pulses.  Pulmonary/Chest: Effort normal.  Musculoskeletal:  Laceration with skin flap to dorsal aspect of left fifth digit over middle phalanx.  No tendons visualized at base of wound.  Active flexion of digit against resistance.  Brisk capillary refill.  Normal distal sensation.  Psychiatric: He has  a normal mood and affect. His behavior is normal.  Nursing note and vitals reviewed.    ED Treatments / Results  Labs (all labs ordered are listed, but only abnormal results are displayed) Labs Reviewed - No data to display  EKG None  Radiology Dg Finger Little Left  Result Date: 02/25/2018 CLINICAL DATA:  Laceration to the finger EXAM: LEFT LITTLE FINGER 2+V COMPARISON:  None. FINDINGS: No fracture or malalignment. Soft tissue injury volar aspect of the mid digit. No definite radiopaque foreign body. IMPRESSION: No acute osseous abnormality Electronically Signed   By: Jasmine Pang  M.D.   On: 02/25/2018 20:17    Procedures .Marland KitchenLaceration Repair Date/Time: 02/25/2018 9:53 PM Performed by: Dexter Sauser, [place] N, PA-C Authorized by: Alexea Blase, [place] N, PA-C   Consent:    Consent obtained:  Verbal   Consent given by:  Patient   Risks discussed:  Infection and poor cosmetic result   Alternatives discussed:  No treatment Anesthesia (see MAR for exact dosages):    Anesthesia method:  Nerve block   Block needle gauge:  25 G   Block anesthetic:  Lidocaine 1% w/o epi   Block injection procedure:  Anatomic landmarks palpated   Block outcome:  Anesthesia achieved Laceration details:    Location:  Finger   Finger location:  L small finger   Length (cm):  1.5 Repair type:    Repair type:  Simple Pre-procedure details:    Preparation:  Patient was prepped and draped in usual sterile fashion Exploration:    Hemostasis achieved with:  Direct pressure   Wound exploration: wound explored through full range of motion and entire depth of wound probed and visualized     Wound extent: no foreign bodies/material noted, no tendon damage noted and no vascular damage noted   Treatment:    Area cleansed with:  Saline   Amount of cleaning:  Standard Skin repair:    Repair method:  Sutures   Suture size:  5-0   Suture material:  Prolene   Suture technique:  Simple interrupted   Number of sutures:  5 Approximation:    Approximation:  Close Post-procedure details:    Dressing:  Non-adherent dressing and splint for protection   Patient tolerance of procedure:  Tolerated well, no immediate complications Comments:     Finger splint applied following nonadherent dressing. Brisk capillary refill following application.   (including critical care time)  Medications Ordered in ED Medications  lidocaine (PF) (XYLOCAINE) 1 % injection 10 mL (has no administration in time range)     Initial Impression / Assessment and Plan / ED Course  I have reviewed the triage vital signs and the  nursing notes.  Pertinent labs & imaging results that were available during my care of the patient were reviewed by me and considered in my medical decision making (see chart for details).     Patient with laceration to left fifth digit that occurred prior to arrival.  Wound irrigated in the ED.  Wound explored and base of wound visualized in a bloodless field without evidence of foreign body.  X-ray negative.  Laceration occurred < 8 hours prior to repair which was well tolerated. Tdap up-to-date.  Pt has  no comorbidities to effect normal wound healing. Pt discharged  without antibiotics.  Discussed suture home care with patient and answered questions. Pt to follow-up for wound check and suture removal in 7 days; they are to return to the ED sooner for signs of infection. Pt is  hemodynamically stable with no complaints prior to dc.   Discussed results, findings, treatment and follow up. Patient advised of return precautions. Patient verbalized understanding and agreed with plan.  Final Clinical Impressions(s) / ED Diagnoses   Final diagnoses:  Laceration of left little finger w/o foreign body w/o damage to nail, initial encounter    ED Discharge Orders    None       Betsaida Missouri, [place] N, PA-C 02/25/18 2155    Pricilla Loveless, MD 02/25/18 [phone]

## 2018-02-25 NOTE — ED Triage Notes (Signed)
Pt has a 1 cm laceration on the anterior portion of left -5th finger. Bleeding controlled. Pt stated that he was cutting an object with a new knife and it slipped.

## 2018-02-25 NOTE — Discharge Instructions (Signed)
Please read instructions below.  Keep your wound clean and covered. In 24 hours, you can get your wound wet; gently clean it with soap and water, pat it dry, and reapply a clean bandage. You can take ibuprofen/advil as needed for pain Follow up with your primary care or urgent care for wound recheck in 7 days.  Return to the ER for fever, pus draining from wound, redness, or new or worsening symptoms.  

## 2019-05-31 ENCOUNTER — Other Ambulatory Visit: Payer: Self-pay

## 2019-05-31 ENCOUNTER — Emergency Department (HOSPITAL_COMMUNITY)
Admission: EM | Admit: 2019-05-31 | Discharge: 2019-05-31 | Disposition: A | Discharge status: Home / Self Care | Payer: Self-pay | Attending: Emergency Medicine | Admitting: Emergency Medicine

## 2019-05-31 ENCOUNTER — Emergency Department (HOSPITAL_COMMUNITY): Payer: Self-pay

## 2019-05-31 DIAGNOSIS — Y939 Activity, unspecified: Secondary | ICD-10-CM | POA: Insufficient documentation

## 2019-05-31 DIAGNOSIS — Y929 Unspecified place or not applicable: Secondary | ICD-10-CM | POA: Insufficient documentation

## 2019-05-31 DIAGNOSIS — Y999 Unspecified external cause status: Secondary | ICD-10-CM | POA: Insufficient documentation

## 2019-05-31 DIAGNOSIS — F17210 Nicotine dependence, cigarettes, uncomplicated: Secondary | ICD-10-CM | POA: Insufficient documentation

## 2019-05-31 DIAGNOSIS — S93401A Sprain of unspecified ligament of right ankle, initial encounter: Secondary | ICD-10-CM | POA: Insufficient documentation

## 2019-05-31 DIAGNOSIS — W2203XA Walked into furniture, initial encounter: Secondary | ICD-10-CM | POA: Insufficient documentation

## 2019-05-31 MED ORDER — HYDROCODONE-ACETAMINOPHEN 5-325 MG PO TABS
1.0000 | ORAL_TABLET | Freq: Once | ORAL | Status: AC
  Administered 2019-05-31: 1 via ORAL
  Filled 2019-05-31: qty 1

## 2019-05-31 NOTE — Discharge Instructions (Addendum)
Recommend weightbearing as tolerated.  Recommend using ankle brace as needed for support.  Can use crutches as needed for mobility.  Recommend Tylenol, Motrin as needed for pain control.  Today also recommend icing.  Please follow-up with primary doctor for recheck as needed.  If you develop numbness, weakness, worsening pain or other new concerning symptoms recommend returning here for recheck.

## 2019-05-31 NOTE — ED Provider Notes (Signed)
[facility] DEPT Provider Note   CSN: [id number] Arrival date & time: 05/31/19  1434     History   Chief Complaint No chief complaint on file.   HPI Darryl Lloyd is a 28 y.o. male.  Patient states actually hit his ankle against his bed this morning.  Initially did not feel much pain and did not think much of it however throughout the morning he is had a steadily increasing amount of pain and is noted some swelling and bruising to the area.  Pain currently moderate severity, over right ankle, radiates down his foot.  No numbness, weakness.  Denies prior injury to this foot or ankle.  Denies medical problems.     HPI  Past Medical History:  Diagnosis Date   Hemorrhoids     There are no active problems to display for this patient.   No past surgical history on file.      Home Medications    Prior to Admission medications   Medication Sig Start Date End Date Taking? Authorizing Provider  acyclovir (ZOVIRAX) 400 MG tablet Take 1 tablet (400 mg total) by mouth 3 (three) times daily. Patient not taking: Reported on 02/03/2018 03/01/17   Okey Regal, PA-C  loperamide (IMODIUM) 2 MG capsule Take 1 capsule (2 mg total) by mouth 4 (four) times daily as needed for diarrhea or loose stools. 02/03/18   McDonald, Mia A, PA-C  magic mouthwash w/lidocaine SOLN Take 5 mLs by mouth 4 (four) times daily as needed for mouth pain. Swish and spit Patient not taking: Reported on 02/03/2018 10/07/16   Clayton Bibles, PA-C  metroNIDAZOLE (FLAGYL) 500 MG tablet Take 1 tablet (500 mg total) by mouth 2 (two) times daily. Patient not taking: Reported on 02/03/2018 06/04/17   Ashley Murrain, NP  naproxen (NAPROSYN) 500 MG tablet Take 1 tablet (500 mg total) by mouth 2 (two) times daily as needed for mild pain or moderate pain. Patient not taking: Reported on 02/03/2018 10/07/16   Clayton Bibles, PA-C  ondansetron (ZOFRAN) 4 MG tablet Take 1 tablet (4 mg total) by mouth every 6  (six) hours. 02/03/18   McDonald, Laymond Purser, PA-C    Family History Family History  Family history unknown: Yes    Social History Social History   Tobacco Use   Smoking status: Current Every Day Smoker    Packs/day: 1.00    Types: Cigarettes   Smokeless tobacco: Never Used  Substance Use Topics   Alcohol use: No   Drug use: Not Currently     Allergies   Patient has no known allergies.   Review of Systems Review of Systems  Constitutional: Negative for chills and fever.  HENT: Negative for ear pain and sore throat.   Eyes: Negative for pain and visual disturbance.  Respiratory: Negative for cough and shortness of breath.   Cardiovascular: Negative for chest pain and palpitations.  Gastrointestinal: Negative for abdominal pain and vomiting.  Genitourinary: Negative for dysuria and hematuria.  Musculoskeletal: Positive for arthralgias. Negative for back pain.  Skin: Negative for color change and rash.  Neurological: Negative for seizures and syncope.  All other systems reviewed and are negative.    Physical Exam Updated Vital Signs There were no vitals taken for this visit.  Physical Exam Vitals signs and nursing note reviewed.  Constitutional:      Appearance: He is well-developed.  HENT:     Head: Normocephalic and atraumatic.  Eyes:     Conjunctiva/sclera: Conjunctivae normal.  Neck:     Musculoskeletal: Neck supple.  Cardiovascular:     Rate and Rhythm: Normal rate and regular rhythm.     Heart sounds: No murmur.  Pulmonary:     Effort: Pulmonary effort is normal. No respiratory distress.     Breath sounds: Normal breath sounds.  Abdominal:     Palpations: Abdomen is soft.     Tenderness: There is no abdominal tenderness.  Musculoskeletal:     Comments: RLE: Mild swelling and ecchymosis noted over medial malleolus, tenderness palpation over ankle, mild tenderness palpation over midfoot, normal foot and ankle range of motion, normal sensation, distal  cap refill, normal PT and DP pulses; no TTP throughout lower leg, knee or thigh or hip  Skin:    General: Skin is warm and dry.  Neurological:     Mental Status: He is alert.      ED Treatments / Results  Labs (all labs ordered are listed, but only abnormal results are displayed) Labs Reviewed - No data to display  EKG None  Radiology No results found.  Procedures Procedures (including critical care time)  Medications Ordered in ED Medications - No data to display   Initial Impression / Assessment and Plan / ED Course  I have reviewed the triage vital signs and the nursing notes.  Pertinent labs & imaging results that were available during my care of the patient were reviewed by me and considered in my medical decision making (see chart for details).  Clinical Course as of May 30 1502  Sun May 31, 2019  1502 Updated on results of xray, will dc home   [RD]    Clinical Course User Index [RD] Milagros Lollykstra, Shyra Emile S, MD       28 year old male presenting with mechanical injury to right ankle.  X-ray negative, suspect ankle sprain.  Provided ankle brace, recommend ice rest and elevation.  Recommended weightbearing as tolerated.  Will discharge home.    After the discussed management above, the patient was determined to be safe for discharge.  The patient was in agreement with this plan and all questions regarding their care were answered.  ED return precautions were discussed and the patient will return to the ED with any significant worsening of condition.    Final Clinical Impressions(s) / ED Diagnoses   Final diagnoses:  Sprain of right ankle, unspecified ligament, initial encounter    ED Discharge Orders    None       Milagros Lollykstra, Therma Lasure S, MD 05/31/19 2254

## 2019-05-31 NOTE — ED Triage Notes (Signed)
States thinks he might have hit his ankle this am on his bed now right ankle pain unable to put pressure on it no deformity noted.

## 2019-08-03 ENCOUNTER — Other Ambulatory Visit: Payer: Self-pay

## 2019-08-03 ENCOUNTER — Emergency Department (HOSPITAL_COMMUNITY)
Admission: EM | Admit: 2019-08-03 | Discharge: 2019-08-03 | Disposition: A | Discharge status: Home / Self Care | Payer: Self-pay | Attending: Emergency Medicine | Admitting: Emergency Medicine

## 2019-08-03 ENCOUNTER — Encounter (HOSPITAL_COMMUNITY): Payer: Self-pay | Admitting: Emergency Medicine

## 2019-08-03 DIAGNOSIS — Z5321 Procedure and treatment not carried out due to patient leaving prior to being seen by health care provider: Secondary | ICD-10-CM | POA: Insufficient documentation

## 2019-08-03 NOTE — ED Triage Notes (Signed)
Pt complaint of "wisdom tooth pain" onset yesterday; right bottom tooth.

## 2019-08-03 NOTE — ED Notes (Signed)
Pt called three times to room from lobby without response.

## 2019-08-09 ENCOUNTER — Encounter (HOSPITAL_COMMUNITY): Payer: Self-pay

## 2019-08-09 ENCOUNTER — Other Ambulatory Visit: Payer: Self-pay

## 2019-08-09 DIAGNOSIS — Z5321 Procedure and treatment not carried out due to patient leaving prior to being seen by health care provider: Secondary | ICD-10-CM | POA: Insufficient documentation

## 2019-08-09 DIAGNOSIS — R220 Localized swelling, mass and lump, head: Secondary | ICD-10-CM | POA: Insufficient documentation

## 2019-08-09 NOTE — ED Triage Notes (Signed)
Pt arrived with complaints of bottom right sided wisdom tooth pain. Swelling noted to right side of face. States he has been taking amoxicillin.

## 2019-08-10 ENCOUNTER — Emergency Department (HOSPITAL_COMMUNITY): Payer: Self-pay

## 2019-08-10 ENCOUNTER — Encounter (HOSPITAL_COMMUNITY): Payer: Self-pay | Admitting: Emergency Medicine

## 2019-08-10 ENCOUNTER — Encounter (HOSPITAL_COMMUNITY)
Admission: EM | Disposition: A | Discharge status: Home / Self Care | Payer: Self-pay | Source: Home / Self Care | Attending: Internal Medicine

## 2019-08-10 ENCOUNTER — Emergency Department (HOSPITAL_COMMUNITY): Payer: Self-pay | Admitting: Registered Nurse

## 2019-08-10 ENCOUNTER — Emergency Department (HOSPITAL_COMMUNITY)
Admission: EM | Admit: 2019-08-10 | Discharge: 2019-08-10 | Disposition: A | Discharge status: Home / Self Care | Payer: Self-pay | Attending: Emergency Medicine | Admitting: Emergency Medicine

## 2019-08-10 ENCOUNTER — Inpatient Hospital Stay (HOSPITAL_COMMUNITY)
Admission: EM | Admit: 2019-08-10 | Discharge: 2019-08-16 | DRG: 857 | Disposition: A | Discharge status: Home / Self Care | Payer: Self-pay | Attending: Family Medicine | Admitting: Family Medicine

## 2019-08-10 DIAGNOSIS — J390 Retropharyngeal and parapharyngeal abscess: Secondary | ICD-10-CM

## 2019-08-10 DIAGNOSIS — L0211 Cutaneous abscess of neck: Secondary | ICD-10-CM | POA: Diagnosis present

## 2019-08-10 DIAGNOSIS — T8140XA Infection following a procedure, unspecified, initial encounter: Principal | ICD-10-CM | POA: Diagnosis present

## 2019-08-10 DIAGNOSIS — Z79899 Other long term (current) drug therapy: Secondary | ICD-10-CM

## 2019-08-10 DIAGNOSIS — Y838 Other surgical procedures as the cause of abnormal reaction of the patient, or of later complication, without mention of misadventure at the time of the procedure: Secondary | ICD-10-CM | POA: Diagnosis present

## 2019-08-10 DIAGNOSIS — F17210 Nicotine dependence, cigarettes, uncomplicated: Secondary | ICD-10-CM | POA: Diagnosis present

## 2019-08-10 DIAGNOSIS — Z791 Long term (current) use of non-steroidal anti-inflammatories (NSAID): Secondary | ICD-10-CM

## 2019-08-10 DIAGNOSIS — H7501 Mastoiditis in infectious and parasitic diseases classified elsewhere, right ear: Secondary | ICD-10-CM | POA: Diagnosis present

## 2019-08-10 DIAGNOSIS — Z20828 Contact with and (suspected) exposure to other viral communicable diseases: Secondary | ICD-10-CM | POA: Diagnosis present

## 2019-08-10 DIAGNOSIS — H70091 Acute mastoiditis with other complications, right ear: Secondary | ICD-10-CM | POA: Diagnosis present

## 2019-08-10 HISTORY — PX: INCISION AND DRAINAGE ABSCESS: SHX5864

## 2019-08-10 HISTORY — DX: Failed or difficult intubation, initial encounter: T88.4XXA

## 2019-08-10 LAB — CBC WITH DIFFERENTIAL/PLATELET
Abs Immature Granulocytes: 0.18 10*3/uL — ABNORMAL HIGH (ref 0.00–0.07)
Basophils Absolute: 0.1 10*3/uL (ref 0.0–0.1)
Basophils Relative: 0 %
Eosinophils Absolute: 0 10*3/uL (ref 0.0–0.5)
Eosinophils Relative: 0 %
HCT: 43.6 % (ref 39.0–52.0)
Hemoglobin: 14.6 g/dL (ref 13.0–17.0)
Immature Granulocytes: 1 %
Lymphocytes Relative: 2 %
Lymphs Abs: 0.5 10*3/uL — ABNORMAL LOW (ref 0.7–4.0)
MCH: 30.9 pg (ref 26.0–34.0)
MCHC: 33.5 g/dL (ref 30.0–36.0)
MCV: 92.4 fL (ref 80.0–100.0)
Monocytes Absolute: 2 10*3/uL — ABNORMAL HIGH (ref 0.1–1.0)
Monocytes Relative: 8 %
Neutro Abs: 22.9 10*3/uL — ABNORMAL HIGH (ref 1.7–7.7)
Neutrophils Relative %: 89 %
Platelets: 458 10*3/uL — ABNORMAL HIGH (ref 150–400)
RBC: 4.72 MIL/uL (ref 4.22–5.81)
RDW: 12.2 % (ref 11.5–15.5)
WBC: 25.6 10*3/uL — ABNORMAL HIGH (ref 4.0–10.5)
nRBC: 0 % (ref 0.0–0.2)

## 2019-08-10 LAB — I-STAT CHEM 8, ED
BUN: 17 mg/dL (ref 6–20)
Calcium, Ion: 1.14 mmol/L — ABNORMAL LOW (ref 1.15–1.40)
Chloride: 95 mmol/L — ABNORMAL LOW (ref 98–111)
Creatinine, Ser: 0.8 mg/dL (ref 0.61–1.24)
Glucose, Bld: 109 mg/dL — ABNORMAL HIGH (ref 70–99)
HCT: 46 % (ref 39.0–52.0)
Hemoglobin: 15.6 g/dL (ref 13.0–17.0)
Potassium: 3.8 mmol/L (ref 3.5–5.1)
Sodium: 134 mmol/L — ABNORMAL LOW (ref 135–145)
TCO2: 29 mmol/L (ref 22–32)

## 2019-08-10 LAB — URINALYSIS, ROUTINE W REFLEX MICROSCOPIC
Bilirubin Urine: NEGATIVE
Glucose, UA: NEGATIVE mg/dL
Hgb urine dipstick: NEGATIVE
Ketones, ur: 20 mg/dL — AB
Leukocytes,Ua: NEGATIVE
Nitrite: NEGATIVE
Protein, ur: NEGATIVE mg/dL
Specific Gravity, Urine: >1.046 — ABNORMAL HIGH (ref 1.005–1.030)
pH: 6 (ref 5.0–8.0)

## 2019-08-10 LAB — SARS CORONAVIRUS 2 BY RT PCR (HOSPITAL ORDER, PERFORMED IN ~~LOC~~ HOSPITAL LAB): SARS Coronavirus 2: NEGATIVE

## 2019-08-10 LAB — LACTIC ACID, PLASMA: Lactic Acid, Venous: 1 mmol/L (ref 0.5–1.9)

## 2019-08-10 SURGERY — INCISION AND DRAINAGE, ABSCESS
Anesthesia: General | Site: Neck | Laterality: Right

## 2019-08-10 MED ORDER — MEPERIDINE HCL 25 MG/ML IJ SOLN: 6.2500 mg | INTRAMUSCULAR | Status: DC | PRN

## 2019-08-10 MED ORDER — SODIUM CHLORIDE 0.9 % IV BOLUS
1000.0000 mL | Freq: Once | INTRAVENOUS | Status: AC
  Administered 2019-08-10: 1000 mL via INTRAVENOUS

## 2019-08-10 MED ORDER — ONDANSETRON HCL 4 MG/2ML IJ SOLN
INTRAMUSCULAR | Status: DC | PRN
  Administered 2019-08-10: 4 mg via INTRAVENOUS

## 2019-08-10 MED ORDER — 0.9 % SODIUM CHLORIDE (POUR BTL) OPTIME
TOPICAL | Status: DC | PRN
  Administered 2019-08-10: 21:00:00 1000 mL

## 2019-08-10 MED ORDER — CLINDAMYCIN PHOSPHATE 600 MG/50ML IV SOLN: 600.0000 mg | Freq: Three times a day (TID) | INTRAVENOUS | Status: DC

## 2019-08-10 MED ORDER — MORPHINE SULFATE (PF) 4 MG/ML IV SOLN
4.0000 mg | Freq: Once | INTRAVENOUS | Status: AC
  Administered 2019-08-10: 4 mg via INTRAVENOUS
  Filled 2019-08-10: qty 1

## 2019-08-10 MED ORDER — SODIUM CHLORIDE 0.9 % IV SOLN
1.5000 g | Freq: Four times a day (QID) | INTRAVENOUS | Status: DC
  Administered 2019-08-11 – 2019-08-16 (×22): 1.5 g via INTRAVENOUS
  Filled 2019-08-10 (×24): qty 4

## 2019-08-10 MED ORDER — CLINDAMYCIN PHOSPHATE 600 MG/50ML IV SOLN
600.0000 mg | Freq: Once | INTRAVENOUS | Status: AC
  Administered 2019-08-10: 600 mg via INTRAVENOUS
  Filled 2019-08-10: qty 50

## 2019-08-10 MED ORDER — HYDROCODONE-ACETAMINOPHEN 7.5-325 MG/15ML PO SOLN
10.0000 mL | ORAL | Status: DC | PRN
  Administered 2019-08-11 – 2019-08-15 (×9): 15 mL via ORAL
  Filled 2019-08-10 (×9): qty 15

## 2019-08-10 MED ORDER — ONDANSETRON HCL 4 MG/2ML IJ SOLN
INTRAMUSCULAR | Status: AC
  Filled 2019-08-10: qty 2

## 2019-08-10 MED ORDER — CLINDAMYCIN PHOSPHATE 600 MG/50ML IV SOLN: 600.0000 mg | Freq: Four times a day (QID) | INTRAVENOUS | Status: DC

## 2019-08-10 MED ORDER — ACETAMINOPHEN 325 MG PO TABS: 325.0000 mg | ORAL_TABLET | Freq: Once | ORAL | Status: DC | PRN

## 2019-08-10 MED ORDER — ONDANSETRON HCL 4 MG/2ML IJ SOLN
4.0000 mg | Freq: Four times a day (QID) | INTRAMUSCULAR | Status: DC | PRN
  Administered 2019-08-13: 4 mg via INTRAVENOUS
  Filled 2019-08-10: qty 2

## 2019-08-10 MED ORDER — ENOXAPARIN SODIUM 40 MG/0.4ML ~~LOC~~ SOLN
40.0000 mg | SUBCUTANEOUS | Status: DC
  Filled 2019-08-10 (×4): qty 0.4

## 2019-08-10 MED ORDER — PROPOFOL 10 MG/ML IV BOLUS
INTRAVENOUS | Status: DC | PRN
  Administered 2019-08-10: 140 mg via INTRAVENOUS
  Administered 2019-08-10 (×2): 20 mg via INTRAVENOUS

## 2019-08-10 MED ORDER — SODIUM CHLORIDE 0.9% FLUSH
3.0000 mL | Freq: Two times a day (BID) | INTRAVENOUS | Status: DC
  Administered 2019-08-11 – 2019-08-12 (×5): 3 mL via INTRAVENOUS

## 2019-08-10 MED ORDER — SODIUM CHLORIDE 0.9% FLUSH: 3.0000 mL | INTRAVENOUS | Status: DC | PRN

## 2019-08-10 MED ORDER — DEXAMETHASONE SODIUM PHOSPHATE 10 MG/ML IJ SOLN
INTRAMUSCULAR | Status: AC
  Filled 2019-08-10: qty 1

## 2019-08-10 MED ORDER — PROMETHAZINE HCL 25 MG/ML IJ SOLN: 6.2500 mg | INTRAMUSCULAR | Status: DC | PRN

## 2019-08-10 MED ORDER — MORPHINE 100MG IN NS 100ML (1MG/ML) PREMIX INFUSION: 1.0000 mg/h | INTRAVENOUS | Status: DC

## 2019-08-10 MED ORDER — SUCCINYLCHOLINE CHLORIDE 200 MG/10ML IV SOSY
PREFILLED_SYRINGE | INTRAVENOUS | Status: AC
  Filled 2019-08-10: qty 10

## 2019-08-10 MED ORDER — LACTATED RINGERS IV SOLN
INTRAVENOUS | Status: DC | PRN
  Administered 2019-08-10 (×2): via INTRAVENOUS

## 2019-08-10 MED ORDER — PROPOFOL 10 MG/ML IV BOLUS
INTRAVENOUS | Status: AC
  Filled 2019-08-10: qty 20

## 2019-08-10 MED ORDER — ROCURONIUM BROMIDE 10 MG/ML (PF) SYRINGE
PREFILLED_SYRINGE | INTRAVENOUS | Status: DC | PRN
  Administered 2019-08-10: 30 mg via INTRAVENOUS

## 2019-08-10 MED ORDER — HYDROMORPHONE HCL 1 MG/ML IJ SOLN: 0.2500 mg | INTRAMUSCULAR | Status: DC | PRN

## 2019-08-10 MED ORDER — FENTANYL CITRATE (PF) 250 MCG/5ML IJ SOLN
INTRAMUSCULAR | Status: AC
  Filled 2019-08-10: qty 5

## 2019-08-10 MED ORDER — BACITRACIN ZINC 500 UNIT/GM EX OINT
1.0000 "application " | TOPICAL_OINTMENT | Freq: Three times a day (TID) | CUTANEOUS | Status: DC
  Administered 2019-08-11 – 2019-08-16 (×14): 1 via TOPICAL
  Filled 2019-08-10: qty 28.35

## 2019-08-10 MED ORDER — ACETAMINOPHEN 10 MG/ML IV SOLN: 1000.0000 mg | Freq: Once | INTRAVENOUS | Status: DC | PRN

## 2019-08-10 MED ORDER — IOHEXOL 300 MG/ML  SOLN
75.0000 mL | Freq: Once | INTRAMUSCULAR | Status: AC | PRN
  Administered 2019-08-10: 75 mL via INTRAVENOUS

## 2019-08-10 MED ORDER — LIDOCAINE 2% (20 MG/ML) 5 ML SYRINGE
INTRAMUSCULAR | Status: DC | PRN
  Administered 2019-08-10: 50 mg via INTRAVENOUS

## 2019-08-10 MED ORDER — MIDAZOLAM HCL 5 MG/5ML IJ SOLN
INTRAMUSCULAR | Status: DC | PRN
  Administered 2019-08-10: 2 mg via INTRAVENOUS

## 2019-08-10 MED ORDER — KCL IN DEXTROSE-NACL 20-5-0.45 MEQ/L-%-% IV SOLN
INTRAVENOUS | Status: DC
  Administered 2019-08-11 – 2019-08-15 (×8): via INTRAVENOUS
  Filled 2019-08-10 (×8): qty 1000

## 2019-08-10 MED ORDER — ACETAMINOPHEN 160 MG/5ML PO SOLN: 650.0000 mg | ORAL | Status: DC | PRN

## 2019-08-10 MED ORDER — LACTATED RINGERS IV SOLN: INTRAVENOUS | Status: DC

## 2019-08-10 MED ORDER — DEXTROSE-NACL 5-0.9 % IV SOLN: INTRAVENOUS | Status: DC

## 2019-08-10 MED ORDER — LACTATED RINGERS IV SOLN
INTRAVENOUS | Status: DC
  Administered 2019-08-10: 21:00:00 via INTRAVENOUS

## 2019-08-10 MED ORDER — SODIUM CHLORIDE 0.9 % IV SOLN
INTRAVENOUS | Status: AC
  Filled 2019-08-10: qty 500000

## 2019-08-10 MED ORDER — CLINDAMYCIN PHOSPHATE 600 MG/50ML IV SOLN
600.0000 mg | Freq: Three times a day (TID) | INTRAVENOUS | Status: DC
  Administered 2019-08-11 – 2019-08-16 (×17): 600 mg via INTRAVENOUS
  Filled 2019-08-10 (×17): qty 50

## 2019-08-10 MED ORDER — MORPHINE SULFATE (PF) 2 MG/ML IV SOLN
2.0000 mg | INTRAVENOUS | Status: DC | PRN
  Administered 2019-08-12: 2 mg via INTRAVENOUS
  Filled 2019-08-10: qty 1

## 2019-08-10 MED ORDER — ACETAMINOPHEN 650 MG RE SUPP: 650.0000 mg | RECTAL | Status: DC | PRN

## 2019-08-10 MED ORDER — SUGAMMADEX SODIUM 200 MG/2ML IV SOLN
INTRAVENOUS | Status: DC | PRN
  Administered 2019-08-10: 200 mg via INTRAVENOUS

## 2019-08-10 MED ORDER — MIDAZOLAM HCL 2 MG/2ML IJ SOLN
INTRAMUSCULAR | Status: AC
  Filled 2019-08-10: qty 2

## 2019-08-10 MED ORDER — DEXAMETHASONE SODIUM PHOSPHATE 10 MG/ML IJ SOLN
INTRAMUSCULAR | Status: DC | PRN
  Administered 2019-08-10: 10 mg via INTRAVENOUS

## 2019-08-10 MED ORDER — SODIUM CHLORIDE 0.9 % IV SOLN
INTRAVENOUS | Status: DC | PRN
  Administered 2019-08-10: 21:00:00 500 mL

## 2019-08-10 MED ORDER — CLINDAMYCIN PHOSPHATE 900 MG/50ML IV SOLN: 900.0000 mg | Freq: Once | INTRAVENOUS | Status: DC

## 2019-08-10 MED ORDER — ONDANSETRON HCL 4 MG PO TABS: 4.0000 mg | ORAL_TABLET | Freq: Four times a day (QID) | ORAL | Status: DC | PRN

## 2019-08-10 MED ORDER — ACETAMINOPHEN 160 MG/5ML PO SOLN: 325.0000 mg | Freq: Once | ORAL | Status: DC | PRN

## 2019-08-10 MED ORDER — SODIUM CHLORIDE 0.9 % IV SOLN
3.0000 g | Freq: Once | INTRAVENOUS | Status: AC
  Administered 2019-08-10: 3 g via INTRAVENOUS
  Filled 2019-08-10: qty 8

## 2019-08-10 MED ORDER — FENTANYL CITRATE (PF) 250 MCG/5ML IJ SOLN
INTRAMUSCULAR | Status: DC | PRN
  Administered 2019-08-10: 50 ug via INTRAVENOUS
  Administered 2019-08-10: 100 ug via INTRAVENOUS
  Administered 2019-08-10 (×2): 50 ug via INTRAVENOUS

## 2019-08-10 MED ORDER — MORPHINE SULFATE (PF) 4 MG/ML IV SOLN: 4.0000 mg | INTRAVENOUS | Status: DC | PRN

## 2019-08-10 MED ORDER — SODIUM CHLORIDE 0.9 % IV BOLUS
1000.0000 mL | Freq: Once | INTRAVENOUS | Status: AC
Start: 2019-08-10 — End: 2019-08-10
  Administered 2019-08-10: 1000 mL via INTRAVENOUS

## 2019-08-10 MED ORDER — LIDOCAINE-EPINEPHRINE 1 %-1:100000 IJ SOLN
INTRAMUSCULAR | Status: AC
  Filled 2019-08-10: qty 1

## 2019-08-10 MED ORDER — SODIUM CHLORIDE 0.9 % IV SOLN: 250.0000 mL | INTRAVENOUS | Status: DC | PRN

## 2019-08-10 MED ORDER — ACETAMINOPHEN 500 MG PO TABS
1000.0000 mg | ORAL_TABLET | Freq: Once | ORAL | Status: AC
  Administered 2019-08-10: 1000 mg via ORAL
  Filled 2019-08-10: qty 2

## 2019-08-10 MED ORDER — LIDOCAINE-EPINEPHRINE 1 %-1:100000 IJ SOLN
INTRAMUSCULAR | Status: DC | PRN
  Administered 2019-08-10: 2 mL

## 2019-08-10 MED ORDER — SUCCINYLCHOLINE CHLORIDE 200 MG/10ML IV SOSY
PREFILLED_SYRINGE | INTRAVENOUS | Status: DC | PRN
  Administered 2019-08-10: 120 mg via INTRAVENOUS

## 2019-08-10 MED ORDER — METHYLPREDNISOLONE SODIUM SUCC 125 MG IJ SOLR
125.0000 mg | Freq: Once | INTRAMUSCULAR | Status: AC
  Administered 2019-08-10: 125 mg via INTRAVENOUS
  Filled 2019-08-10: qty 2

## 2019-08-10 MED ORDER — LIDOCAINE 2% (20 MG/ML) 5 ML SYRINGE
INTRAMUSCULAR | Status: AC
  Filled 2019-08-10: qty 5

## 2019-08-10 SURGICAL SUPPLY — 29 items
CANISTER SUCT 3000ML PPV (MISCELLANEOUS) ×2 IMPLANT
CATH ROBINSON RED A/P 12FR (CATHETERS) ×2 IMPLANT
COAGULATOR SUCT 8FR VV (MISCELLANEOUS) ×2 IMPLANT
COVER SURGICAL LIGHT HANDLE (MISCELLANEOUS) ×2 IMPLANT
DECANTER SPIKE VIAL GLASS SM (MISCELLANEOUS) ×2 IMPLANT
DRAIN PENROSE 1/4X12 LTX STRL (WOUND CARE) ×2 IMPLANT
DRAPE HALF SHEET 40X57 (DRAPES) ×2 IMPLANT
ELECT COATED BLADE 2.86 ST (ELECTRODE) ×2 IMPLANT
ELECT REM PT RETURN 9FT ADLT (ELECTROSURGICAL) ×2
ELECTRODE REM PT RTRN 9FT ADLT (ELECTROSURGICAL) ×1 IMPLANT
GAUZE SPONGE 4X4 12PLY STRL (GAUZE/BANDAGES/DRESSINGS) ×2 IMPLANT
GLOVE SS BIOGEL STRL SZ 7.5 (GLOVE) ×1 IMPLANT
GLOVE SUPERSENSE BIOGEL SZ 7.5 (GLOVE) ×1
GOWN STRL REUS W/ TWL LRG LVL3 (GOWN DISPOSABLE) ×1 IMPLANT
GOWN STRL REUS W/ TWL XL LVL3 (GOWN DISPOSABLE) ×1 IMPLANT
GOWN STRL REUS W/TWL LRG LVL3 (GOWN DISPOSABLE) ×1
GOWN STRL REUS W/TWL XL LVL3 (GOWN DISPOSABLE) ×1
KIT BASIN OR (CUSTOM PROCEDURE TRAY) ×2 IMPLANT
KIT TURNOVER KIT B (KITS) ×2 IMPLANT
MARKER SKIN DUAL TIP RULER LAB (MISCELLANEOUS) ×2 IMPLANT
NEEDLE HYPO 25GX1X1/2 BEV (NEEDLE) ×2 IMPLANT
NS IRRIG 1000ML POUR BTL (IV SOLUTION) ×2 IMPLANT
PAD ARMBOARD 7.5X6 YLW CONV (MISCELLANEOUS) ×2 IMPLANT
PENCIL BUTTON HOLSTER BLD 10FT (ELECTRODE) ×2 IMPLANT
SUT SILK 2 0 PERMA HAND 18 BK (SUTURE) ×2 IMPLANT
SWAB COLLECTION DEVICE MRSA (MISCELLANEOUS) ×2 IMPLANT
SWAB CULTURE ESWAB REG 1ML (MISCELLANEOUS) ×2 IMPLANT
TOWEL GREEN STERILE FF (TOWEL DISPOSABLE) ×2 IMPLANT
TRAY ENT MC OR (CUSTOM PROCEDURE TRAY) ×2 IMPLANT

## 2019-08-10 NOTE — Anesthesia Procedure Notes (Signed)
Procedure Name: Intubation Date/Time: 08/10/2019 9:06 PM Performed by: Jearld Pies, CRNA Pre-anesthesia Checklist: Patient identified, Emergency Drugs available, Suction available and Patient being monitored Patient Re-evaluated:Patient Re-evaluated prior to induction Oxygen Delivery Method: Circle System Utilized Preoxygenation: Pre-oxygenation with 100% oxygen Induction Type: IV induction and Rapid sequence Laryngoscope Size: Mac and 3 Grade View: Grade II Tube type: Oral Number of attempts: 1 Airway Equipment and Method: Stylet Placement Confirmation: ETT inserted through vocal cords under direct vision,  positive ETCO2 and breath sounds checked- equal and bilateral Secured at: 22 cm Tube secured with: Tape Dental Injury: Teeth and Oropharynx as per pre-operative assessment  Comments: Laryngeal swelling from right tooth abscess, unable to view true vocal cords during DL. + intubation, bilateral clear breath sounds, teeth and lips remain in preoperative state.

## 2019-08-10 NOTE — H&P (Addendum)
History and Physical    Kaylan Friedmann MBW:[id number] DOB: 1991-01-26 DOA: 08/10/2019  PCP: Patient, No Pcp Per    Patient coming from: home    Chief Complaint: right side neck pain  HPI: Lelon Ikard is a 28 y.o. male with medical history significant of neck pain  ED Course: Received pain medication and clindamycin ED physician discussed the case with ENT for OR tonight  Review of Systems: As per HPI otherwise 10 point review of systems negative.  Right-sided neck pain otherwise negative  Past Medical History:  Diagnosis Date  . Hemorrhoids     History reviewed. No pertinent surgical history.   reports that he has been smoking cigarettes. He has been smoking about 1.00 pack per day. He has never used smokeless tobacco. He reports previous drug use. He reports that he does not drink alcohol.  No Known Allergies  Family History  Family history unknown: Yes     Prior to Admission medications   Medication Sig Start Date End Date Taking? Authorizing Provider  acyclovir (ZOVIRAX) 400 MG tablet Take 1 tablet (400 mg total) by mouth 3 (three) times daily. Patient not taking: Reported on 02/03/2018 03/01/17   Eyvonne Mechanic, PA-C  loperamide (IMODIUM) 2 MG capsule Take 1 capsule (2 mg total) by mouth 4 (four) times daily as needed for diarrhea or loose stools. 02/03/18   McDonald, Mia A, PA-C  magic mouthwash w/lidocaine SOLN Take 5 mLs by mouth 4 (four) times daily as needed for mouth pain. Swish and spit Patient not taking: Reported on 02/03/2018 10/07/16   Trixie Dredge, PA-C  metroNIDAZOLE (FLAGYL) 500 MG tablet Take 1 tablet (500 mg total) by mouth 2 (two) times daily. Patient not taking: Reported on 02/03/2018 06/04/17   Janne Napoleon, NP  naproxen (NAPROSYN) 500 MG tablet Take 1 tablet (500 mg total) by mouth 2 (two) times daily as needed for mild pain or moderate pain. Patient not taking: Reported on 02/03/2018 10/07/16   Trixie Dredge, PA-C  ondansetron (ZOFRAN) 4 MG tablet  Take 1 tablet (4 mg total) by mouth every 6 (six) hours. 02/03/18   Barkley Boards, PA-C    Physical Exam: Vitals:   08/10/19 1945 08/10/19 2000 08/10/19 2015 08/10/19 2030  BP:      Pulse: 77 83 68 72  Resp: 11 (!) 23 19 (!) 24  Temp:      TempSrc:      SpO2: 96% 98% 97% 95%  Weight:      Height:        Constitutional: NAD, calm, comfortable Vitals:   08/10/19 1945 08/10/19 2000 08/10/19 2015 08/10/19 2030  BP:      Pulse: 77 83 68 72  Resp: 11 (!) 23 19 (!) 24  Temp:      TempSrc:      SpO2: 96% 98% 97% 95%  Weight:      Height:       Eyes: PERRL, lids and conjunctivae normal ENMT: Mucous membranes are moist. Posterior pharynx clear of any exudate or lesions.Normal dentition.  Neck: normal, supple, no masses, no thyromegaly Respiratory: clear to auscultation bilaterally, no wheezing, no crackles. Normal respiratory effort. No accessory muscle use.  Cardiovascular: Regular rate and rhythm, no murmurs / rubs / gallops. No extremity edema. 2+ pedal pulses. No carotid bruits.  Abdomen: no tenderness, no masses palpated. No hepatosplenomegaly. Bowel sounds positive.  Musculoskeletal: no clubbing / cyanosis. No joint deformity upper and lower extremities. Good ROM, no contractures. Normal muscle  tone.  Skin: no rashes, lesions, ulcers. No induration Neurologic: CN 2-12 grossly intact. Sensation intact, DTR normal. Strength 5/5 in all 4.  Psychiatric: Normal judgment and insight. Alert and oriented x 3. Normal mood.    Labs on Admission: I have personally reviewed following labs and imaging studies  CBC: Recent Labs  Lab 08/10/19 1313 08/10/19 1437  WBC 25.6*  --   NEUTROABS 22.9*  --   HGB 14.6 15.6  HCT 43.6 46.0  MCV 92.4  --   PLT 458*  --    Basic Metabolic Panel: Recent Labs  Lab 08/10/19 1437  NA 134*  K 3.8  CL 95*  GLUCOSE 109*  BUN 17  CREATININE 0.80   GFR: Estimated Creatinine Clearance: 120.6 mL/min (by C-G formula based on SCr of 0.8 mg/dL).  Liver Function Tests: No results for input(s): AST, ALT, ALKPHOS, BILITOT, PROT, ALBUMIN in the last 168 hours. No results for input(s): LIPASE, AMYLASE in the last 168 hours. No results for input(s): AMMONIA in the last 168 hours. Coagulation Profile: No results for input(s): INR, PROTIME in the last 168 hours. Cardiac Enzymes: No results for input(s): CKTOTAL, CKMB, CKMBINDEX, TROPONINI in the last 168 hours. BNP (last 3 results) No results for input(s): PROBNP in the last 8760 hours. HbA1C: No results for input(s): HGBA1C in the last 72 hours. CBG: No results for input(s): GLUCAP in the last 168 hours. Lipid Profile: No results for input(s): CHOL, HDL, LDLCALC, TRIG, CHOLHDL, LDLDIRECT in the last 72 hours. Thyroid Function Tests: No results for input(s): TSH, T4TOTAL, FREET4, T3FREE, THYROIDAB in the last 72 hours. Anemia Panel: No results for input(s): VITAMINB12, FOLATE, FERRITIN, TIBC, IRON, RETICCTPCT in the last 72 hours. Urine analysis:    Component Value Date/Time   COLORURINE YELLOW 08/10/2019 1840   APPEARANCEUR CLEAR 08/10/2019 1840   LABSPEC >1.046 (H) 08/10/2019 1840   PHURINE 6.0 08/10/2019 1840   GLUCOSEU NEGATIVE 08/10/2019 1840   HGBUR NEGATIVE 08/10/2019 1840   BILIRUBINUR NEGATIVE 08/10/2019 1840   KETONESUR 20 (A) 08/10/2019 1840   PROTEINUR NEGATIVE 08/10/2019 1840   UROBILINOGEN 0.2 06/22/2010 1119   NITRITE NEGATIVE 08/10/2019 1840   LEUKOCYTESUR NEGATIVE 08/10/2019 1840    Radiological Exams on Admission:    Verle Wheeling G Akaiya Touchette MD [facility]  If 7PM-7AM, please contact night-coverage [email or web address]   08/10/2019, 9:08 PM   History and Physical    Yuvraj Pfeifer ZOX:[id number] DOB: 01/04/1991 DOA: 08/10/2019  PCP: Patient, No Pcp Per    Patient coming from: Home    Chief Complaint: 28 years old male came with a chief complaint of right side neck pain started few days ago.  Patient saw his dentist and was prescribed amoxicillin    HPI: No significant past medical history  ED Course: Patient seen by emergency room physician Given with clindamycin CT scan of the neck right side abscess Covid negative  Review of Systems: As per HPI otherwise 10 point review of systems negative.  Pain tenderness right side of the neck  Past Medical History:  Diagnosis Date  . Hemorrhoids     History reviewed. No pertinent surgical history.   reports that he has been smoking cigarettes. He has been smoking about 1.00 pack per day. He has never used smokeless tobacco. He reports previous drug use. He reports that he does not drink alcohol.  No Known Allergies  Family History  Family history unknown: Yes     Prior to Admission medications   Medication Sig Start Date  End Date Taking? Authorizing Provider  acyclovir (ZOVIRAX) 400 MG tablet Take 1 tablet (400 mg total) by mouth 3 (three) times daily. Patient not taking: Reported on 02/03/2018 03/01/17   Eyvonne Mechanic, PA-C  loperamide (IMODIUM) 2 MG capsule Take 1 capsule (2 mg total) by mouth 4 (four) times daily as needed for diarrhea or loose stools. 02/03/18   McDonald, Mia A, PA-C  magic mouthwash w/lidocaine SOLN Take 5 mLs by mouth 4 (four) times daily as needed for mouth pain. Swish and spit Patient not taking: Reported on 02/03/2018 10/07/16   Trixie Dredge, PA-C  metroNIDAZOLE (FLAGYL) 500 MG tablet Take 1 tablet (500 mg total) by mouth 2 (two) times daily. Patient not taking: Reported on 02/03/2018 06/04/17   Janne Napoleon, NP  naproxen (NAPROSYN) 500 MG tablet Take 1 tablet (500 mg total) by mouth 2 (two) times daily as needed for mild pain or moderate pain. Patient not taking: Reported on 02/03/2018 10/07/16   Trixie Dredge, PA-C  ondansetron (ZOFRAN) 4 MG tablet Take 1 tablet (4 mg total) by mouth every 6 (six) hours. 02/03/18   Barkley Boards, PA-C    Physical Exam: Vitals:   08/10/19 1945 08/10/19 2000 08/10/19 2015 08/10/19 2030  BP:      Pulse: 77 83 68 72  Resp:  11 (!) 23 19 (!) 24  Temp:      TempSrc:      SpO2: 96% 98% 97% 95%  Weight:      Height:        Constitutional: NAD, calm, comfortable Vitals:   08/10/19 1945 08/10/19 2000 08/10/19 2015 08/10/19 2030  BP:      Pulse: 77 83 68 72  Resp: 11 (!) 23 19 (!) 24  Temp:      TempSrc:      SpO2: 96% 98% 97% 95%  Weight:      Height:       Eyes: PERRL, lids and conjunctivae normal ENMT: Unable to open his mouth Neck: Right neck mass extended to submandibular area, tender Respiratory: clear to auscultation bilaterally, no wheezing, no crackles. Normal respiratory effort. No accessory muscle use.  Cardiovascular: Regular rate and rhythm, no murmurs / rubs / gallops. No extremity edema. 2+ pedal pulses. No carotid bruits.  Abdomen: no tenderness, no masses palpated. No hepatosplenomegaly. Bowel sounds positive.  Musculoskeletal: no clubbing / cyanosis. No joint deformity upper and lower extremities. Good ROM, no contractures. Normal muscle tone.  Skin: no rashes, lesions, ulcers. No induration Neurologic: CN 2-12 grossly intact. Sensation intact, DTR normal. Strength 5/5 in all 4.  Psychiatric: Normal judgment and insight. Alert and oriented x 3. Normal mood.    Labs on Admission: I have personally reviewed following labs and imaging studies  CBC: Recent Labs  Lab 08/10/19 1313 08/10/19 1437  WBC 25.6*  --   NEUTROABS 22.9*  --   HGB 14.6 15.6  HCT 43.6 46.0  MCV 92.4  --   PLT 458*  --    Basic Metabolic Panel: Recent Labs  Lab 08/10/19 1437  NA 134*  K 3.8  CL 95*  GLUCOSE 109*  BUN 17  CREATININE 0.80   GFR: Estimated Creatinine Clearance: 120.6 mL/min (by C-G formula based on SCr of 0.8 mg/dL). Liver Function Tests: No results for input(s): AST, ALT, ALKPHOS, BILITOT, PROT, ALBUMIN in the last 168 hours. No results for input(s): LIPASE, AMYLASE in the last 168 hours. No results for input(s): AMMONIA in the last 168 hours. Coagulation  Profile: No results for  input(s): INR, PROTIME in the last 168 hours. Cardiac Enzymes: No results for input(s): CKTOTAL, CKMB, CKMBINDEX, TROPONINI in the last 168 hours. BNP (last 3 results) No results for input(s): PROBNP in the last 8760 hours. HbA1C: No results for input(s): HGBA1C in the last 72 hours. CBG: No results for input(s): GLUCAP in the last 168 hours. Lipid Profile: No results for input(s): CHOL, HDL, LDLCALC, TRIG, CHOLHDL, LDLDIRECT in the last 72 hours. Thyroid Function Tests: No results for input(s): TSH, T4TOTAL, FREET4, T3FREE, THYROIDAB in the last 72 hours. Anemia Panel: No results for input(s): VITAMINB12, FOLATE, FERRITIN, TIBC, IRON, RETICCTPCT in the last 72 hours. Urine analysis:    Component Value Date/Time   COLORURINE YELLOW 08/10/2019 1840   APPEARANCEUR CLEAR 08/10/2019 1840   LABSPEC >1.046 (H) 08/10/2019 1840   PHURINE 6.0 08/10/2019 1840   GLUCOSEU NEGATIVE 08/10/2019 1840   HGBUR NEGATIVE 08/10/2019 1840   BILIRUBINUR NEGATIVE 08/10/2019 1840   KETONESUR 20 (A) 08/10/2019 1840   PROTEINUR NEGATIVE 08/10/2019 1840   UROBILINOGEN 0.2 06/22/2010 1119   NITRITE NEGATIVE 08/10/2019 1840   LEUKOCYTESUR NEGATIVE 08/10/2019 1840    Radiological Exams on Admission: Ct Soft Tissue Neck W Contrast  Result Date: 08/10/2019 CLINICAL DATA:  28 year old male with recent wisdom tooth extraction and right lower jaw swelling. EXAM: CT NECK WITH CONTRAST TECHNIQUE: Multidetector CT imaging of the neck was performed using the standard protocol following the bolus administration of intravenous contrast. CONTRAST:  27mL OMNIPAQUE IOHEXOL 300 MG/ML  SOLN COMPARISON:  None. FINDINGS: Pharynx and larynx: Abnormal pharynx with a large gas and fluid containing multi spatial abscess involving the right tonsil (series 3, image 23). The tonsillar component of the infected collection is roughly 36 x 39 x 45 millimeters (AP by transverse by CC), estimated at 32 milliliters. The abnormality tracks  through the right parapharyngeal space into the right masticator and submandibular spaces. Severe mass effect on the pharyngeal airway which is shifted to the left. The left parapharyngeal space remains normal. There is a retropharyngeal effusion, but no gas in the retropharyngeal space to strongly suggest communication with the large abscess. Gas distended hypopharynx with motion artifact there and at the larynx which seem to remain normal. Salivary glands: Grossly negative sublingual space. Submandibular glands are symmetrically enhancing. The trans spatial right side abscess extends into the right parotid space from the adjacent right masticator space, containing air in fluid. The left parotid remains normal. Thyroid: Negative. Lymph nodes: Reactive appearing right level 2A lymph node measures about 16 millimeter short axis. Vascular: Motion artifact, but the major vascular structures in the neck and at the skull base seem to remain patent-although the superior right IJ is completely effaced and the transposed Scholl abscess directly abuts the right carotid space on series 3, image 16. Limited intracranial: Negative. Visualized orbits: Partially visible, negative. Mastoids and visualized paranasal sinuses: Only trace paranasal sinus mucosal thickening. Visible right tympanic cavity and mastoid are clear. Skeleton: Extracted right mandible wisdom tooth but with cortical breakthrough through the medial inferior mandible as seen on series 5, image 37 and sagittal image 40. Carious nearby right maxillary wisdom tooth. Carious contralateral left side wisdom teeth. The remainder of the right mandible is intact. The right TMJ is normally located. No other acute osseous abnormality identified. Upper chest: Negative trachea and other visible major airways. The visible upper lungs are clear aside from what appears to be an area of mild left upper lobe scarring on series 5,  image 90. Normal superior mediastinum. IMPRESSION:  1. Severe multi-spatial Right Neck Abscess containing air and fluid and with a bulky 32 mL right tonsillar component. Involvement of the right: Parapharyngeal, CAROTID, masticator, submandibular, and parotid spaces. Effaced but not thrombosed Right IJ. Retropharyngeal space relatively spared at this time; small retropharyngeal effusion. Sublingual space appears spared. 2. Narrowing of the oropharyngeal airway. Negative visible mediastinum and upper chest. Study discussed by telephone with PA-C CORTNI COUTURE on 08/10/2019 at 16:14 . Electronically Signed   By: Odessa FlemingH  Hall M.D.   On: 08/10/2019 16:17    EKG: Inde  Assessment/Plan Chest the right side of the neck Plan n.p.o. IV fluids pain control ENT was consulted by emergency room physician for I&D Clindamycin 600 every 8h    DVT prophylaxis: Lovenox started tomorrow Code Status: Full code Family Communication:  Disposition Plan:  Consults called: By ED Admission status: Full admit   Cobie Leidner G Merilyn Pagan MD [facility]  If 7PM-7AM, please contact night-coverage [email or web address]   08/10/2019, 9:09 PM

## 2019-08-10 NOTE — ED Triage Notes (Signed)
Pt arrives to the ED for complaints of right lower jaw swelling from wisdom teeth extraction on Monday. Pt states he was prescribed antibiotics today, but is unable to get them bc he cannot afford them. Pt barely able to speak due to swelling and pain. His dentist told him he needs the abscess drained.

## 2019-08-10 NOTE — Anesthesia Preprocedure Evaluation (Addendum)
Anesthesia Evaluation  Patient identified by MRN, date of birth, ID band Patient awake    Reviewed: Allergy & Precautions, NPO status , Patient's Chart, lab work & pertinent test results  Airway Mallampati: IV   Neck ROM: Limited  Mouth opening: Limited Mouth Opening  Dental  (+) Teeth Intact, Dental Advisory Given   Pulmonary Current Smoker,    breath sounds clear to auscultation       Cardiovascular negative cardio ROS   Rhythm:Regular Rate:Normal     Neuro/Psych negative neurological ROS  negative psych ROS   GI/Hepatic negative GI ROS, Neg liver ROS,   Endo/Other  negative endocrine ROS  Renal/GU negative Renal ROS     Musculoskeletal negative musculoskeletal ROS (+)   Abdominal Normal abdominal exam  (+)   Peds  Hematology negative hematology ROS (+)   Anesthesia Other Findings   Reproductive/Obstetrics                            Anesthesia Physical Anesthesia Plan  ASA: II and emergent  Anesthesia Plan: General   Post-op Pain Management:    Induction: Intravenous  PONV Risk Score and Plan: 2 and Ondansetron, Dexamethasone and Midazolam  Airway Management Planned: Oral ETT  Additional Equipment: None  Intra-op Plan:   Post-operative Plan: Extubation in OR  Informed Consent: I have reviewed the patients History and Physical, chart, labs and discussed the procedure including the risks, benefits and alternatives for the proposed anesthesia with the patient or authorized representative who has indicated his/her understanding and acceptance.     Dental advisory given  Plan Discussed with: CRNA  Anesthesia Plan Comments:        Anesthesia Quick Evaluation

## 2019-08-10 NOTE — Brief Op Note (Signed)
08/10/2019  10:23 PM  PATIENT:  Aris Lot  28 y.o. male  PRE-OPERATIVE DIAGNOSIS:  Right Neck abscess  POST-OPERATIVE DIAGNOSIS:  Right Neck abscess  PROCEDURE:  Procedure(s): INCISION AND DRAINAGE RIGHT NECK  ABSCESS, PHARYNGEAL ABSCESS (Right)  SURGEON:  Surgeon(s) and Role:    Rozetta Nunnery, MD - Primary  PHYSICIAN ASSISTANT:   ASSISTANTS: none   ANESTHESIA:   general  EBL:  20 mL   BLOOD ADMINISTERED:none  DRAINS: Penrose drain in the neck and right soft palate   LOCAL MEDICATIONS USED:  XYLOCAINE   SPECIMEN:  No Specimen  DISPOSITION OF SPECIMEN:  N/A  COUNTS:  YES  TOURNIQUET:  * No tourniquets in log *  DICTATION: .Other Dictation: Dictation Number [id number]  PLAN OF CARE: Admit to inpatient   PATIENT DISPOSITION:  PACU - hemodynamically stable.   Delay start of Pharmacological VTE agent (>24hrs) due to surgical blood loss or risk of bleeding: yes

## 2019-08-10 NOTE — ED Notes (Signed)
Patient transported to CT 

## 2019-08-10 NOTE — Transfer of Care (Signed)
Immediate Anesthesia Transfer of Care Note  Patient: Darryl Lloyd  Procedure(s) Performed: INCISION AND DRAINAGE RIGHT NECK  ABSCESS, PHARYNGEAL ABSCESS (Right Neck)  Patient Location: PACU  Anesthesia Type:General  Level of Consciousness: drowsy, patient cooperative and responds to stimulation  Airway & Oxygen Therapy: Patient Spontanous Breathing and Patient connected to face mask oxygen  Post-op Assessment: Report given to RN and Post -op Vital signs reviewed and stable  Post vital signs: Reviewed and stable  Last Vitals:  Vitals Value Taken Time  BP 116/76 08/10/19 2227  Temp    Pulse 96 08/10/19 2228  Resp 20 08/10/19 2228  SpO2 100 % 08/10/19 2228  Vitals shown include unvalidated device data.  Last Pain:  Vitals:   08/10/19 1901  TempSrc:   PainSc: 5          Complications: No apparent anesthesia complications

## 2019-08-10 NOTE — Consult Note (Signed)
Reason for Consult: Evaluate patient with right neck right parapharyngeal abscess Referring Physician: [place]  Ulises Wolfinger is an 28 y.o. male.  HPI: Patient is 1 week status post extraction of his right lower wisdom tooth.  Following extraction patient has had increasing pain and swelling in his neck and throat.  He saw his dentist earlier today because of increasing pain.  He was attempted to be referred to an oral surgeon but the dentist could not find an oral surgeon that would see him and patient subsequently was sent to the ED.  Patient had a CT scan performed in the ED that demonstrated extensive right parapharyngeal right neck abscess.  He is taken to the operating room at this time for incision and drainage of abscess.  He apparently received Cleocin and Unasyn in the ED.  Past Medical History:  Diagnosis Date  . Hemorrhoids     History reviewed. No pertinent surgical history.  Social History:  reports that he has been smoking cigarettes. He has been smoking about 1.00 pack per day. He has never used smokeless tobacco. He reports previous drug use. He reports that he does not drink alcohol.  Allergies: No Known Allergies  Medications: I have reviewed the patient's current medications.  Results for orders placed or performed during the hospital encounter of 08/10/19 (from the past 48 hour(s))  CBC with Differential     Status: Abnormal   Collection Time: 08/10/19  1:13 PM  Result Value Ref Range   WBC 25.6 (H) 4.0 - 10.5 K/uL   RBC 4.72 4.22 - 5.81 MIL/uL   Hemoglobin 14.6 13.0 - 17.0 g/dL   HCT 40.9 81.1 - 91.4 %   MCV 92.4 80.0 - 100.0 fL   MCH 30.9 26.0 - 34.0 pg   MCHC 33.5 30.0 - 36.0 g/dL   RDW 78.2 95.6 - 21.3 %   Platelets 458 (H) 150 - 400 K/uL   nRBC 0.0 0.0 - 0.2 %   Neutrophils Relative % 89 %   Neutro Abs 22.9 (H) 1.7 - 7.7 K/uL   Lymphocytes Relative 2 %   Lymphs Abs 0.5 (L) 0.7 - 4.0 K/uL   Monocytes Relative 8 %   Monocytes Absolute 2.0 (H) 0.1 - 1.0  K/uL   Eosinophils Relative 0 %   Eosinophils Absolute 0.0 0.0 - 0.5 K/uL   Basophils Relative 0 %   Basophils Absolute 0.1 0.0 - 0.1 K/uL   Immature Granulocytes 1 %   Abs Immature Granulocytes 0.18 (H) 0.00 - 0.07 K/uL    Comment: Performed at [facility], [address], [place], [place] [address]  I-stat chem 8, ED (not at [facility] or [facility]     Status: Abnormal   Collection Time: 08/10/19  2:37 PM  Result Value Ref Range   Sodium 134 (L) 135 - 145 mmol/L   Potassium 3.8 3.5 - 5.1 mmol/L   Chloride 95 (L) 98 - 111 mmol/L   BUN 17 6 - 20 mg/dL   Creatinine, Ser 8.46 0.61 - 1.24 mg/dL   Glucose, Bld 962 (H) 70 - 99 mg/dL   Calcium, Ion 9.52 (L) 1.15 - 1.40 mmol/L   TCO2 29 22 - 32 mmol/L   Hemoglobin 15.6 13.0 - 17.0 g/dL   HCT 84.1 32.4 - 40.1 %  Lactic acid, plasma     Status: None   Collection Time: 08/10/19  4:49 PM  Result Value Ref Range   Lactic Acid, Venous 1.0 0.5 - 1.9 mmol/L  Comment: Performed at [facility], [place] [address] [place], [place] [address]  SARS Coronavirus 2 by RT PCR (hospital order, performed in [facility] hospital lab) Nasopharyngeal Nasopharyngeal Swab     Status: None   Collection Time: 08/10/19  6:15 PM   Specimen: Nasopharyngeal Swab  Result Value Ref Range   SARS Coronavirus 2 NEGATIVE NEGATIVE    Comment: (NOTE) If result is NEGATIVE SARS-CoV-2 target nucleic acids are NOT DETECTED. The SARS-CoV-2 RNA is generally detectable in upper and lower  respiratory specimens during the acute phase of infection. The lowest  concentration of SARS-CoV-2 viral copies this assay can detect is 250  copies / mL. A negative result does not preclude SARS-CoV-2 infection  and should not be used as the sole basis for treatment or other  patient management decisions.  A negative result may occur with  improper specimen collection / handling, submission of specimen other  than nasopharyngeal swab, presence of viral mutation(s) within the  areas  targeted by this assay, and inadequate number of viral copies  (<250 copies / mL). A negative result must be combined with clinical  observations, patient history, and epidemiological information. If result is POSITIVE SARS-CoV-2 target nucleic acids are DETECTED. The SARS-CoV-2 RNA is generally detectable in upper and lower  respiratory specimens dur ing the acute phase of infection.  Positive  results are indicative of active infection with SARS-CoV-2.  Clinical  correlation with patient history and other diagnostic information is  necessary to determine patient infection status.  Positive results do  not rule out bacterial infection or co-infection with other viruses. If result is PRESUMPTIVE POSTIVE SARS-CoV-2 nucleic acids MAY BE PRESENT.   A presumptive positive result was obtained on the submitted specimen  and confirmed on repeat testing.  While 2019 novel coronavirus  (SARS-CoV-2) nucleic acids may be present in the submitted sample  additional confirmatory testing may be necessary for epidemiological  and / or clinical management purposes  to differentiate between  SARS-CoV-2 and other Sarbecovirus currently known to infect humans.  If clinically indicated additional testing with an alternate test  methodology [phone]) is advised. The SARS-CoV-2 RNA is generally  detectable in upper and lower respiratory sp ecimens during the acute  phase of infection. The expected result is Negative. Fact Sheet for Patients:  StrictlyIdeas.no Fact Sheet for Healthcare Providers: BankingDealers.co.za This test is not yet approved or cleared by the [place] FDA and has been authorized for detection and/or diagnosis of SARS-CoV-2 by FDA under an Emergency Use Authorization (EUA).  This EUA will remain in effect (meaning this test can be used) for the duration of the COVID-19 declaration under Section 564(b)(1) of the Act, 21 U.S.C. section  360bbb-3(b)(1), unless the authorization is terminated or revoked sooner. Performed at [facility], [place] [address], [place], [place] [address]   Urinalysis, Routine w reflex microscopic     Status: Abnormal   Collection Time: 08/10/19  6:40 PM  Result Value Ref Range   Color, Urine YELLOW YELLOW   APPearance CLEAR CLEAR   Specific Gravity, Urine >1.046 (H) 1.005 - 1.030   pH 6.0 5.0 - 8.0   Glucose, UA NEGATIVE NEGATIVE mg/dL   Hgb urine dipstick NEGATIVE NEGATIVE   Bilirubin Urine NEGATIVE NEGATIVE   Ketones, ur 20 (A) NEGATIVE mg/dL   Protein, ur NEGATIVE NEGATIVE mg/dL   Nitrite NEGATIVE NEGATIVE   Leukocytes,Ua NEGATIVE NEGATIVE   RBC / HPF 0-5 0 - 5 RBC/hpf   WBC,  UA 0-5 0 - 5 WBC/hpf   Bacteria, UA RARE (A) NONE SEEN   Mucus PRESENT     Comment: Performed at [facility], [address] [place], [place] [address]    Ct Soft Tissue Neck W Contrast  Result Date: 08/10/2019 CLINICAL DATA:  28 year old male with recent wisdom tooth extraction and right lower jaw swelling. EXAM: CT NECK WITH CONTRAST TECHNIQUE: Multidetector CT imaging of the neck was performed using the standard protocol following the bolus administration of intravenous contrast. CONTRAST:  75mL OMNIPAQUE IOHEXOL 300 MG/ML  SOLN COMPARISON:  None. FINDINGS: Pharynx and larynx: Abnormal pharynx with a large gas and fluid containing multi spatial abscess involving the right tonsil (series 3, image 23). The tonsillar component of the infected collection is roughly 36 x 39 x 45 millimeters (AP by transverse by CC), estimated at 32 milliliters. The abnormality tracks through the right parapharyngeal space into the right masticator and submandibular spaces. Severe mass effect on the pharyngeal airway which is shifted to the left. The left parapharyngeal space remains normal. There is a retropharyngeal effusion, but no gas in the retropharyngeal space to strongly suggest communication with the large abscess. Gas  distended hypopharynx with motion artifact there and at the larynx which seem to remain normal. Salivary glands: Grossly negative sublingual space. Submandibular glands are symmetrically enhancing. The trans spatial right side abscess extends into the right parotid space from the adjacent right masticator space, containing air in fluid. The left parotid remains normal. Thyroid: Negative. Lymph nodes: Reactive appearing right level 2A lymph node measures about 16 millimeter short axis. Vascular: Motion artifact, but the major vascular structures in the neck and at the skull base seem to remain patent-although the superior right IJ is completely effaced and the transposed Scholl abscess directly abuts the right carotid space on series 3, image 16. Limited intracranial: Negative. Visualized orbits: Partially visible, negative. Mastoids and visualized paranasal sinuses: Only trace paranasal sinus mucosal thickening. Visible right tympanic cavity and mastoid are clear. Skeleton: Extracted right mandible wisdom tooth but with cortical breakthrough through the medial inferior mandible as seen on series 5, image 37 and sagittal image 40. Carious nearby right maxillary wisdom tooth. Carious contralateral left side wisdom teeth. The remainder of the right mandible is intact. The right TMJ is normally located. No other acute osseous abnormality identified. Upper chest: Negative trachea and other visible major airways. The visible upper lungs are clear aside from what appears to be an area of mild left upper lobe scarring on series 5, image 90. Normal superior mediastinum. IMPRESSION: 1. Severe multi-spatial Right Neck Abscess containing air and fluid and with a bulky 32 mL right tonsillar component. Involvement of the right: Parapharyngeal, CAROTID, masticator, submandibular, and parotid spaces. Effaced but not thrombosed Right IJ. Retropharyngeal space relatively spared at this time; small retropharyngeal effusion. Sublingual  space appears spared. 2. Narrowing of the oropharyngeal airway. Negative visible mediastinum and upper chest. Study discussed by telephone with PA-C CORTNI COUTURE on 08/10/2019 at 16:14 . Electronically Signed   By: Odessa FlemingH  Hall M.D.   On: 08/10/2019 16:17    ROS: Negative   PE: Patient is thin and in no acute distress except for pain and swelling of the right neck.  He is having no airway problems but has difficulty talking and swallowing. Ears: Ear canals and TMs are clear Nasal exam: Clear nasal passages Oral exam: Patient has mild trismus in diffuse swelling of the right soft palate.  He has minimal drainage  from the right lower wisdom tooth extraction site. Neck exam: Diffuse swelling of the right neck which is very tender to palpation. Lungs: Clear to auscultation Cardiac exam: Regular rate and rhythm without murmur tachycardia. Neuro exam: Patient is alert and oriented  Assessment/Plan: Right neck right parapharyngeal abscess following wisdom tooth extraction 1 week ago.  We will plan on taking patient to the operating room for incision and drainage of abscess via the neck as well as intraoral We will continue with IV antibiotics and obtain cultures.  Dillard Cannon 08/10/2019, 8:57 PM

## 2019-08-10 NOTE — ED Provider Notes (Signed)
[facility] EMERGENCY DEPARTMENT Provider Note   CSN: [id number] Arrival date & time: 08/10/19  1143     History   Chief Complaint Chief Complaint  Patient presents with   Abscess    HPI Darryl Lloyd is a 28 y.o. male.     HPI   Patient is a 28 year old male with a history of hemorrhoids who presents to the emergency department today for evaluation of right-sided facial swelling and pain.  Patient had his right lower wisdom tooth removed 6 days ago by a dentist.  He had previously been on antibiotics but finished those.  He saw his dentist earlier today who attempted to get him seen by maxillofacial surgeon same day however but unable to do so.  He was advised to come to the ED for further evaluation.  Patient reports that for the last 4 days he has had swelling to his neck and throat.  He also reports changes to his voice and painful swallowing.  Past Medical History:  Diagnosis Date   Difficult intubation    Hemorrhoids     Patient Active Problem List   Diagnosis Date Noted   Abscess of neck 08/10/2019   Acute mastoiditis of right side with abscess of neck 08/10/2019   Neck abscess 08/10/2019    History reviewed. No pertinent surgical history.      Home Medications    Prior to Admission medications   Medication Sig Start Date End Date Taking? Authorizing Provider  loperamide (IMODIUM) 2 MG capsule Take 1 capsule (2 mg total) by mouth 4 (four) times daily as needed for diarrhea or loose stools. 02/03/18  Yes McDonald, Mia A, PA-C  acyclovir (ZOVIRAX) 400 MG tablet Take 1 tablet (400 mg total) by mouth 3 (three) times daily. Patient not taking: Reported on 02/03/2018 03/01/17   Okey Regal, PA-C  magic mouthwash w/lidocaine SOLN Take 5 mLs by mouth 4 (four) times daily as needed for mouth pain. Swish and spit Patient not taking: Reported on 02/03/2018 10/07/16   Clayton Bibles, PA-C  metroNIDAZOLE (FLAGYL) 500 MG tablet Take 1 tablet (500 mg  total) by mouth 2 (two) times daily. Patient not taking: Reported on 02/03/2018 06/04/17   Ashley Murrain, NP  naproxen (NAPROSYN) 500 MG tablet Take 1 tablet (500 mg total) by mouth 2 (two) times daily as needed for mild pain or moderate pain. Patient not taking: Reported on 02/03/2018 10/07/16   Clayton Bibles, PA-C  ondansetron (ZOFRAN) 4 MG tablet Take 1 tablet (4 mg total) by mouth every 6 (six) hours. 02/03/18   McDonald, Laymond Purser, PA-C    Family History Family History  Family history unknown: Yes    Social History Social History   Tobacco Use   Smoking status: Current Every Day Smoker    Packs/day: 1.00    Types: Cigarettes   Smokeless tobacco: Never Used  Substance Use Topics   Alcohol use: No   Drug use: Not Currently     Allergies   Patient has no known allergies.   Review of Systems Review of Systems  Constitutional: Negative for fever.  HENT: Positive for facial swelling, sore throat and voice change. Negative for ear pain.   Eyes: Negative for pain and visual disturbance.  Respiratory: Negative for cough and shortness of breath.   Cardiovascular: Negative for chest pain.  Gastrointestinal: Negative for abdominal pain, nausea and vomiting.  Genitourinary: Negative for dysuria and hematuria.  Musculoskeletal: Negative for back pain.  Skin: Negative for rash.  Neurological: Negative for headaches.  All other systems reviewed and are negative.    Physical Exam Updated Vital Signs BP 123/85    Pulse 88    Temp (!) 100.4 F (38 C) (Oral)    Resp 19    Ht 5\' 11"  (1.803 m)    Wt 62 kg    SpO2 95%    BMI 19.06 kg/m   Physical Exam Vitals signs and nursing note reviewed.  Constitutional:      Appearance: He is well-developed.  HENT:     Head: Normocephalic and atraumatic.     Mouth/Throat:     Comments: Significant swelling and erythema to the posterior oropharynx/soft palate.  Patient with trismus.  I am unable to fully visualize the extraction site.  He has  sublingual tenderness line.  Significant swelling noted to the right side of the face with induration, erythema, and warmth.  Patient with abnormal phonation.  He is tolerating secretions. Eyes:     Conjunctiva/sclera: Conjunctivae normal.  Neck:     Musculoskeletal: Neck supple.  Cardiovascular:     Rate and Rhythm: Normal rate and regular rhythm.     Pulses: Normal pulses.     Heart sounds: Normal heart sounds. No murmur.  Pulmonary:     Effort: Pulmonary effort is normal. No respiratory distress.     Breath sounds: Normal breath sounds. No wheezing, rhonchi or rales.  Abdominal:     General: Bowel sounds are normal.     Palpations: Abdomen is soft.     Tenderness: There is no abdominal tenderness.  Skin:    General: Skin is warm and dry.  Neurological:     Mental Status: He is alert.      ED Treatments / Results  Labs (all labs ordered are listed, but only abnormal results are displayed) Labs Reviewed  CBC WITH DIFFERENTIAL/PLATELET - Abnormal; Notable for the following components:      Result Value   WBC 25.6 (*)    Platelets 458 (*)    Neutro Abs 22.9 (*)    Lymphs Abs 0.5 (*)    Monocytes Absolute 2.0 (*)    Abs Immature Granulocytes 0.18 (*)    All other components within normal limits  URINALYSIS, ROUTINE W REFLEX MICROSCOPIC - Abnormal; Notable for the following components:   Specific Gravity, Urine >1.046 (*)    Ketones, ur 20 (*)    Bacteria, UA RARE (*)    All other components within normal limits  I-STAT CHEM 8, ED - Abnormal; Notable for the following components:   Sodium 134 (*)    Chloride 95 (*)    Glucose, Bld 109 (*)    Calcium, Ion 1.14 (*)    All other components within normal limits  SARS CORONAVIRUS 2 BY RT PCR (HOSPITAL ORDER, PERFORMED IN [facility])  AEROBIC/ANAEROBIC CULTURE (SURGICAL/DEEP WOUND)  CULTURE, BLOOD (ROUTINE X 2)  CULTURE, BLOOD (ROUTINE X 2)  URINE CULTURE  LACTIC ACID, PLASMA  BASIC METABOLIC PANEL  CBC    HIV ANTIBODY (ROUTINE TESTING W REFLEX)    EKG None  Radiology Ct Soft Tissue Neck W Contrast  Result Date: 08/10/2019 CLINICAL DATA:  28 year old male with recent wisdom tooth extraction and right lower jaw swelling. EXAM: CT NECK WITH CONTRAST TECHNIQUE: Multidetector CT imaging of the neck was performed using the standard protocol following the bolus administration of intravenous contrast. CONTRAST:  35mL OMNIPAQUE IOHEXOL 300 MG/ML  SOLN COMPARISON:  None. FINDINGS: Pharynx and larynx: Abnormal  pharynx with a large gas and fluid containing multi spatial abscess involving the right tonsil (series 3, image 23). The tonsillar component of the infected collection is roughly 36 x 39 x 45 millimeters (AP by transverse by CC), estimated at 32 milliliters. The abnormality tracks through the right parapharyngeal space into the right masticator and submandibular spaces. Severe mass effect on the pharyngeal airway which is shifted to the left. The left parapharyngeal space remains normal. There is a retropharyngeal effusion, but no gas in the retropharyngeal space to strongly suggest communication with the large abscess. Gas distended hypopharynx with motion artifact there and at the larynx which seem to remain normal. Salivary glands: Grossly negative sublingual space. Submandibular glands are symmetrically enhancing. The trans spatial right side abscess extends into the right parotid space from the adjacent right masticator space, containing air in fluid. The left parotid remains normal. Thyroid: Negative. Lymph nodes: Reactive appearing right level 2A lymph node measures about 16 millimeter short axis. Vascular: Motion artifact, but the major vascular structures in the neck and at the skull base seem to remain patent-although the superior right IJ is completely effaced and the transposed Scholl abscess directly abuts the right carotid space on series 3, image 16. Limited intracranial: Negative. Visualized  orbits: Partially visible, negative. Mastoids and visualized paranasal sinuses: Only trace paranasal sinus mucosal thickening. Visible right tympanic cavity and mastoid are clear. Skeleton: Extracted right mandible wisdom tooth but with cortical breakthrough through the medial inferior mandible as seen on series 5, image 37 and sagittal image 40. Carious nearby right maxillary wisdom tooth. Carious contralateral left side wisdom teeth. The remainder of the right mandible is intact. The right TMJ is normally located. No other acute osseous abnormality identified. Upper chest: Negative trachea and other visible major airways. The visible upper lungs are clear aside from what appears to be an area of mild left upper lobe scarring on series 5, image 90. Normal superior mediastinum. IMPRESSION: 1. Severe multi-spatial Right Neck Abscess containing air and fluid and with a bulky 32 mL right tonsillar component. Involvement of the right: Parapharyngeal, CAROTID, masticator, submandibular, and parotid spaces. Effaced but not thrombosed Right IJ. Retropharyngeal space relatively spared at this time; small retropharyngeal effusion. Sublingual space appears spared. 2. Narrowing of the oropharyngeal airway. Negative visible mediastinum and upper chest. Study discussed by telephone with PA-C Osiris Charles on 08/10/2019 at 16:14 . Electronically Signed   By: Odessa Fleming M.D.   On: 08/10/2019 16:17    Procedures Procedures (including critical care time) CRITICAL CARE Performed by: Karrie Meres   Total critical care time: 45 minutes  Critical care time was exclusive of separately billable procedures and treating other patients.  Critical care was necessary to treat or prevent imminent or life-threatening deterioration.  Critical care was time spent personally by me on the following activities: development of treatment plan with patient and/or surrogate as well as nursing, discussions with consultants, evaluation of  patient's response to treatment, examination of patient, obtaining history from patient or surrogate, ordering and performing treatments and interventions, ordering and review of laboratory studies, ordering and review of radiographic studies, pulse oximetry and re-evaluation of patient's condition.   Medications Ordered in ED Medications  lactated ringers infusion ( Intravenous New Bag/Given 08/10/19 2050)  enoxaparin (LOVENOX) injection 40 mg (has no administration in time range)  dextrose 5 %-0.9 % sodium chloride infusion (has no administration in time range)  sodium chloride flush (NS) 0.9 % injection 3 mL (has no administration  in time range)  sodium chloride flush (NS) 0.9 % injection 3 mL (has no administration in time range)  0.9 %  sodium chloride infusion (has no administration in time range)  morphine 4 MG/ML injection 4 mg (has no administration in time range)  morphine 100mg  in NS 100mL (1mg /mL) infusion - premix (has no administration in time range)  ondansetron (ZOFRAN) tablet 4 mg (has no administration in time range)    Or  ondansetron (ZOFRAN) injection 4 mg (has no administration in time range)  clindamycin (CLEOCIN) IVPB 600 mg (has no administration in time range)  lactated ringers infusion (has no administration in time range)  acetaminophen (TYLENOL) tablet 325-650 mg (has no administration in time range)    Or  acetaminophen (TYLENOL) 160 MG/5ML solution 325-650 mg (has no administration in time range)  HYDROmorphone (DILAUDID) injection 0.25-0.5 mg (has no administration in time range)  acetaminophen (OFIRMEV) IV 1,000 mg (has no administration in time range)  meperidine (DEMEROL) injection 6.25-12.5 mg (has no administration in time range)  promethazine (PHENERGAN) injection 6.25-12.5 mg (has no administration in time range)  ampicillin-sulbactam (UNASYN) 1.5 g in sodium chloride 0.9 % 100 mL IVPB (has no administration in time range)  clindamycin (CLEOCIN) IVPB  600 mg (has no administration in time range)  clindamycin (CLEOCIN) IVPB 600 mg (0 mg Intravenous Stopped 08/10/19 1433)  morphine 4 MG/ML injection 4 mg (4 mg Intravenous Given 08/10/19 1359)  sodium chloride 0.9 % bolus 1,000 mL (0 mLs Intravenous Stopped 08/10/19 1542)  methylPREDNISolone sodium succinate (SOLU-MEDROL) 125 mg/2 mL injection 125 mg (125 mg Intravenous Given 08/10/19 1434)  iohexol (OMNIPAQUE) 300 MG/ML solution 75 mL (75 mLs Intravenous Contrast Given 08/10/19 1515)  sodium chloride 0.9 % bolus 1,000 mL (0 mLs Intravenous Stopped 08/10/19 1805)  morphine 4 MG/ML injection 4 mg (4 mg Intravenous Given 08/10/19 1700)  acetaminophen (TYLENOL) tablet 1,000 mg (1,000 mg Oral Given 08/10/19 1805)  Ampicillin-Sulbactam (UNASYN) 3 g in sodium chloride 0.9 % 100 mL IVPB (0 g Intravenous Stopped 08/10/19 2037)  propofol (DIPRIVAN) 10 mg/mL bolus/IV push (has no administration in time range)  midazolam (VERSED) 2 MG/2ML injection (has no administration in time range)  fentaNYL (SUBLIMAZE) 250 MCG/5ML injection (has no administration in time range)  sodium chloride 0.9 % with polymyxin B, bacitracin ADS Med (has no administration in time range)  lidocaine-EPINEPHrine (XYLOCAINE W/EPI) 1 %-1:100000 (with pres) injection (has no administration in time range)  succinylcholine (ANECTINE) 200 MG/10ML syringe (has no administration in time range)  lidocaine 20 MG/ML injection (has no administration in time range)  dexamethasone (DECADRON) 10 MG/ML injection (has no administration in time range)  ondansetron (ZOFRAN) 4 MG/2ML injection (has no administration in time range)  propofol (DIPRIVAN) 10 mg/mL bolus/IV push (has no administration in time range)     Initial Impression / Assessment and Plan / ED Course  I have reviewed the triage vital signs and the nursing notes.  Pertinent labs & imaging results that were available during my care of the patient were reviewed by me and considered in my  medical decision making (see chart for details).   Final Clinical Impressions(s) / ED Diagnoses   Final diagnoses:  Neck abscess   28 year old male presenting for evaluation of right-sided facial swelling and pain following right lower wisdom tooth extraction.  States he completed a course of antibiotics recently but for the last 3 to 4 days he has had increased pain/swelling.  Patient with significant swelling on his a.m. but  able to speak and tolerate secretions.  Initially afebrile with normal vital signs however developed fever while in the ED.  Code sepsis was initiated.  Patient given fluids and broad-spectrum antibiotics.  CBC reveals leukocytosis of 25,000 No gross electrolyte derangement.  Normal creatinine. Lactic acid negative UA without evidence of infection Covid testing negative.  CT soft tissue neck with: 1. Severe multi-spatial Right Neck Abscess containing air and fluid and with a bulky 32 mL right tonsillar component. Involvement of the right: Parapharyngeal, CAROTID, masticator, submandibular, and parotid spaces. Effaced but not thrombosed Right IJ. Retropharyngeal space relatively spared at this time; small retropharyngeal effusion. Sublingual space appears spared.  2. Narrowing of the oropharyngeal airway. Negative visible mediastinum and upper chest.   Consult with ENT, Dr. Ezzard Standing who evaluated the patient at bedside.  He reviewed imaging studies and feels that surgery may be more appropriate for oral/maxillofacial surgery.  Amended contacting [facility] health.  Discussed case with [facility] health transfer line.  They stated they do not think they have oral surgery on call but will try to get in touch with dental provider on call.  6:54 PM Transfer line unable to get in touch with dental.  Will attempt to call ENT.  7:30 PM Pt awake, alert, talking and able to tolerate secretions.   7:33 PM [facility] states that ENT will call back shortly, the  physician is in the OR.   7:49 PM Discussed with Dr. Sharen Counter with ENT at [facility], he states that pt can be transferred if pt unable to have surgery at [facility].   8:00 PM Discussed case with Dr. Ezzard Standing, he will take patient to the OR but recommends admission to the hospitalist service.  8:11 PM Discussed case with Dr. Nelda Bucks who accepts patient for admission.     ED Discharge Orders    None       Rayne Du 08/10/19 2259    Charlynne Pander, MD 08/11/19 [phone]

## 2019-08-10 NOTE — H&P (View-Only) (Signed)
Reason for Consult: Evaluate patient with right neck right parapharyngeal abscess Referring Physician: [place]  Darryl Lloyd is an 28 y.o. male.  HPI: Patient is 1 week status post extraction of his right lower wisdom tooth.  Following extraction patient has had increasing pain and swelling in his neck and throat.  He saw his dentist earlier today because of increasing pain.  He was attempted to be referred to an oral surgeon but the dentist could not find an oral surgeon that would see him and patient subsequently was sent to the ED.  Patient had a CT scan performed in the ED that demonstrated extensive right parapharyngeal right neck abscess.  He is taken to the operating room at this time for incision and drainage of abscess.  He apparently received Cleocin and Unasyn in the ED.  Past Medical History:  Diagnosis Date  . Hemorrhoids     History reviewed. No pertinent surgical history.  Social History:  reports that he has been smoking cigarettes. He has been smoking about 1.00 pack per day. He has never used smokeless tobacco. He reports previous drug use. He reports that he does not drink alcohol.  Allergies: No Known Allergies  Medications: I have reviewed the patient's current medications.  Results for orders placed or performed during the hospital encounter of 08/10/19 (from the past 48 hour(s))  CBC with Differential     Status: Abnormal   Collection Time: 08/10/19  1:13 PM  Result Value Ref Range   WBC 25.6 (H) 4.0 - 10.5 K/uL   RBC 4.72 4.22 - 5.81 MIL/uL   Hemoglobin 14.6 13.0 - 17.0 g/dL   HCT 40.9 81.1 - 91.4 %   MCV 92.4 80.0 - 100.0 fL   MCH 30.9 26.0 - 34.0 pg   MCHC 33.5 30.0 - 36.0 g/dL   RDW 78.2 95.6 - 21.3 %   Platelets 458 (H) 150 - 400 K/uL   nRBC 0.0 0.0 - 0.2 %   Neutrophils Relative % 89 %   Neutro Abs 22.9 (H) 1.7 - 7.7 K/uL   Lymphocytes Relative 2 %   Lymphs Abs 0.5 (L) 0.7 - 4.0 K/uL   Monocytes Relative 8 %   Monocytes Absolute 2.0 (H) 0.1 - 1.0  K/uL   Eosinophils Relative 0 %   Eosinophils Absolute 0.0 0.0 - 0.5 K/uL   Basophils Relative 0 %   Basophils Absolute 0.1 0.0 - 0.1 K/uL   Immature Granulocytes 1 %   Abs Immature Granulocytes 0.18 (H) 0.00 - 0.07 K/uL    Comment: Performed at [facility], [address], [place], [place] [address]  I-stat chem 8, ED (not at [facility] or [facility]     Status: Abnormal   Collection Time: 08/10/19  2:37 PM  Result Value Ref Range   Sodium 134 (L) 135 - 145 mmol/L   Potassium 3.8 3.5 - 5.1 mmol/L   Chloride 95 (L) 98 - 111 mmol/L   BUN 17 6 - 20 mg/dL   Creatinine, Ser 8.46 0.61 - 1.24 mg/dL   Glucose, Bld 962 (H) 70 - 99 mg/dL   Calcium, Ion 9.52 (L) 1.15 - 1.40 mmol/L   TCO2 29 22 - 32 mmol/L   Hemoglobin 15.6 13.0 - 17.0 g/dL   HCT 84.1 32.4 - 40.1 %  Lactic acid, plasma     Status: None   Collection Time: 08/10/19  4:49 PM  Result Value Ref Range   Lactic Acid, Venous 1.0 0.5 - 1.9 mmol/L  Comment: Performed at [facility], [place] [address] [place], [place] [address]  SARS Coronavirus 2 by RT PCR (hospital order, performed in [facility] hospital lab) Nasopharyngeal Nasopharyngeal Swab     Status: None   Collection Time: 08/10/19  6:15 PM   Specimen: Nasopharyngeal Swab  Result Value Ref Range   SARS Coronavirus 2 NEGATIVE NEGATIVE    Comment: (NOTE) If result is NEGATIVE SARS-CoV-2 target nucleic acids are NOT DETECTED. The SARS-CoV-2 RNA is generally detectable in upper and lower  respiratory specimens during the acute phase of infection. The lowest  concentration of SARS-CoV-2 viral copies this assay can detect is 250  copies / mL. A negative result does not preclude SARS-CoV-2 infection  and should not be used as the sole basis for treatment or other  patient management decisions.  A negative result may occur with  improper specimen collection / handling, submission of specimen other  than nasopharyngeal swab, presence of viral mutation(s) within the  areas  targeted by this assay, and inadequate number of viral copies  (<250 copies / mL). A negative result must be combined with clinical  observations, patient history, and epidemiological information. If result is POSITIVE SARS-CoV-2 target nucleic acids are DETECTED. The SARS-CoV-2 RNA is generally detectable in upper and lower  respiratory specimens dur ing the acute phase of infection.  Positive  results are indicative of active infection with SARS-CoV-2.  Clinical  correlation with patient history and other diagnostic information is  necessary to determine patient infection status.  Positive results do  not rule out bacterial infection or co-infection with other viruses. If result is PRESUMPTIVE POSTIVE SARS-CoV-2 nucleic acids MAY BE PRESENT.   A presumptive positive result was obtained on the submitted specimen  and confirmed on repeat testing.  While 2019 novel coronavirus  (SARS-CoV-2) nucleic acids may be present in the submitted sample  additional confirmatory testing may be necessary for epidemiological  and / or clinical management purposes  to differentiate between  SARS-CoV-2 and other Sarbecovirus currently known to infect humans.  If clinically indicated additional testing with an alternate test  methodology [phone]) is advised. The SARS-CoV-2 RNA is generally  detectable in upper and lower respiratory sp ecimens during the acute  phase of infection. The expected result is Negative. Fact Sheet for Patients:  StrictlyIdeas.no Fact Sheet for Healthcare Providers: BankingDealers.co.za This test is not yet approved or cleared by the [place] FDA and has been authorized for detection and/or diagnosis of SARS-CoV-2 by FDA under an Emergency Use Authorization (EUA).  This EUA will remain in effect (meaning this test can be used) for the duration of the COVID-19 declaration under Section 564(b)(1) of the Act, 21 U.S.C. section  360bbb-3(b)(1), unless the authorization is terminated or revoked sooner. Performed at [facility], [place] [address], [place], [place] [address]   Urinalysis, Routine w reflex microscopic     Status: Abnormal   Collection Time: 08/10/19  6:40 PM  Result Value Ref Range   Color, Urine YELLOW YELLOW   APPearance CLEAR CLEAR   Specific Gravity, Urine >1.046 (H) 1.005 - 1.030   pH 6.0 5.0 - 8.0   Glucose, UA NEGATIVE NEGATIVE mg/dL   Hgb urine dipstick NEGATIVE NEGATIVE   Bilirubin Urine NEGATIVE NEGATIVE   Ketones, ur 20 (A) NEGATIVE mg/dL   Protein, ur NEGATIVE NEGATIVE mg/dL   Nitrite NEGATIVE NEGATIVE   Leukocytes,Ua NEGATIVE NEGATIVE   RBC / HPF 0-5 0 - 5 RBC/hpf   WBC,  UA 0-5 0 - 5 WBC/hpf   Bacteria, UA RARE (A) NONE SEEN   Mucus PRESENT     Comment: Performed at [facility], [address] [place], [place] [address]    Ct Soft Tissue Neck W Contrast  Result Date: 08/10/2019 CLINICAL DATA:  28 year old male with recent wisdom tooth extraction and right lower jaw swelling. EXAM: CT NECK WITH CONTRAST TECHNIQUE: Multidetector CT imaging of the neck was performed using the standard protocol following the bolus administration of intravenous contrast. CONTRAST:  75mL OMNIPAQUE IOHEXOL 300 MG/ML  SOLN COMPARISON:  None. FINDINGS: Pharynx and larynx: Abnormal pharynx with a large gas and fluid containing multi spatial abscess involving the right tonsil (series 3, image 23). The tonsillar component of the infected collection is roughly 36 x 39 x 45 millimeters (AP by transverse by CC), estimated at 32 milliliters. The abnormality tracks through the right parapharyngeal space into the right masticator and submandibular spaces. Severe mass effect on the pharyngeal airway which is shifted to the left. The left parapharyngeal space remains normal. There is a retropharyngeal effusion, but no gas in the retropharyngeal space to strongly suggest communication with the large abscess. Gas  distended hypopharynx with motion artifact there and at the larynx which seem to remain normal. Salivary glands: Grossly negative sublingual space. Submandibular glands are symmetrically enhancing. The trans spatial right side abscess extends into the right parotid space from the adjacent right masticator space, containing air in fluid. The left parotid remains normal. Thyroid: Negative. Lymph nodes: Reactive appearing right level 2A lymph node measures about 16 millimeter short axis. Vascular: Motion artifact, but the major vascular structures in the neck and at the skull base seem to remain patent-although the superior right IJ is completely effaced and the transposed Scholl abscess directly abuts the right carotid space on series 3, image 16. Limited intracranial: Negative. Visualized orbits: Partially visible, negative. Mastoids and visualized paranasal sinuses: Only trace paranasal sinus mucosal thickening. Visible right tympanic cavity and mastoid are clear. Skeleton: Extracted right mandible wisdom tooth but with cortical breakthrough through the medial inferior mandible as seen on series 5, image 37 and sagittal image 40. Carious nearby right maxillary wisdom tooth. Carious contralateral left side wisdom teeth. The remainder of the right mandible is intact. The right TMJ is normally located. No other acute osseous abnormality identified. Upper chest: Negative trachea and other visible major airways. The visible upper lungs are clear aside from what appears to be an area of mild left upper lobe scarring on series 5, image 90. Normal superior mediastinum. IMPRESSION: 1. Severe multi-spatial Right Neck Abscess containing air and fluid and with a bulky 32 mL right tonsillar component. Involvement of the right: Parapharyngeal, CAROTID, masticator, submandibular, and parotid spaces. Effaced but not thrombosed Right IJ. Retropharyngeal space relatively spared at this time; small retropharyngeal effusion. Sublingual  space appears spared. 2. Narrowing of the oropharyngeal airway. Negative visible mediastinum and upper chest. Study discussed by telephone with PA-C CORTNI COUTURE on 08/10/2019 at 16:14 . Electronically Signed   By: Odessa FlemingH  Hall M.D.   On: 08/10/2019 16:17    ROS: Negative   PE: Patient is thin and in no acute distress except for pain and swelling of the right neck.  He is having no airway problems but has difficulty talking and swallowing. Ears: Ear canals and TMs are clear Nasal exam: Clear nasal passages Oral exam: Patient has mild trismus in diffuse swelling of the right soft palate.  He has minimal drainage  from the right lower wisdom tooth extraction site. Neck exam: Diffuse swelling of the right neck which is very tender to palpation. Lungs: Clear to auscultation Cardiac exam: Regular rate and rhythm without murmur tachycardia. Neuro exam: Patient is alert and oriented  Assessment/Plan: Right neck right parapharyngeal abscess following wisdom tooth extraction 1 week ago.  We will plan on taking patient to the operating room for incision and drainage of abscess via the neck as well as intraoral We will continue with IV antibiotics and obtain cultures.  Amaiya Scruton 08/10/2019, 8:57 PM     

## 2019-08-10 NOTE — Interval H&P Note (Signed)
History and Physical Interval Note:  08/10/2019 9:04 PM  Darryl Lloyd  has presented today for surgery, with the diagnosis of Right Neck abscess.  The various methods of treatment have been discussed with the patient and family. After consideration of risks, benefits and other options for treatment, the patient has consented to  Procedure(s): INCISION AND DRAINAGE RIGHT NECK  ABSCESS, PHARYNGEAL ABSCESS (Right) as a surgical intervention.  The patient's history has been reviewed, patient examined, no change in status, stable for surgery.  I have reviewed the patient's chart and labs.  Questions were answered to the patient's satisfaction.     Melony Overly

## 2019-08-10 NOTE — Progress Notes (Signed)
Notified provider of need to order a second antibiotic with clindamycin for the sepsis protocol if appropriate for this patient.

## 2019-08-11 ENCOUNTER — Encounter (HOSPITAL_COMMUNITY): Payer: Self-pay | Admitting: Otolaryngology

## 2019-08-11 LAB — URINE CULTURE: Culture: NO GROWTH

## 2019-08-11 LAB — BASIC METABOLIC PANEL
Anion gap: 11 (ref 5–15)
BUN: 12 mg/dL (ref 6–20)
CO2: 24 mmol/L (ref 22–32)
Calcium: 8.4 mg/dL — ABNORMAL LOW (ref 8.9–10.3)
Chloride: 101 mmol/L (ref 98–111)
Creatinine, Ser: 0.85 mg/dL (ref 0.61–1.24)
GFR calc Af Amer: >60 mL/min (ref >60)
GFR calc non Af Amer: >60 mL/min (ref >60)
Glucose, Bld: 105 mg/dL — ABNORMAL HIGH (ref 70–99)
Potassium: 4.1 mmol/L (ref 3.5–5.1)
Sodium: 136 mmol/L (ref 135–145)

## 2019-08-11 LAB — CBC
HCT: 37.4 % — ABNORMAL LOW (ref 39.0–52.0)
Hemoglobin: 12.5 g/dL — ABNORMAL LOW (ref 13.0–17.0)
MCH: 31 pg (ref 26.0–34.0)
MCHC: 33.4 g/dL (ref 30.0–36.0)
MCV: 92.8 fL (ref 80.0–100.0)
Platelets: 427 10*3/uL — ABNORMAL HIGH (ref 150–400)
RBC: 4.03 MIL/uL — ABNORMAL LOW (ref 4.22–5.81)
RDW: 12.5 % (ref 11.5–15.5)
WBC: 27.7 10*3/uL — ABNORMAL HIGH (ref 4.0–10.5)
nRBC: 0 % (ref 0.0–0.2)

## 2019-08-11 LAB — HIV ANTIBODY (ROUTINE TESTING W REFLEX): HIV Screen 4th Generation wRfx: NONREACTIVE

## 2019-08-11 MED ORDER — MORPHINE SULFATE (PF) 4 MG/ML IV SOLN: 4.0000 mg | INTRAVENOUS | Status: DC | PRN

## 2019-08-11 MED ORDER — SENNA 8.6 MG PO TABS
1.0000 | ORAL_TABLET | Freq: Every day | ORAL | Status: DC
  Administered 2019-08-11: 8.6 mg via ORAL
  Filled 2019-08-11 (×4): qty 1

## 2019-08-11 MED ORDER — DOCUSATE SODIUM 100 MG PO CAPS
100.0000 mg | ORAL_CAPSULE | Freq: Two times a day (BID) | ORAL | Status: DC
  Administered 2019-08-11 – 2019-08-12 (×2): 100 mg via ORAL
  Filled 2019-08-11 (×9): qty 1

## 2019-08-11 NOTE — Progress Notes (Signed)
  PROGRESS NOTE    Darryl Lloyd  JWJ:[id number] DOB: [birth year] DOA: 08/10/2019 PCP: Patient, No Pcp Per    Brief Narrative:  28 year old Hispanic male admitted with right neck swelling and jaw pain leading to difficulty opening mouth.  Patient noted to have right parapharyngeal abscess on CT imaging and is status post I&D by ENT on 08/10/2019.   Assessment & Plan:   Right parapharyngeal abscess: Status post incision and drainage by ENT -Continue IV Unasyn and IV clindamycin per ENT -Follow drain output and further management of drain per ENT   DVT prophylaxis: Lovenox SQ Code Status:Full code   Consultants:   ENT  Procedures:   Incision and drainage on 08/10/2019 by ENT of right parapharyngeal abscess  Antimicrobials:  IV Unasyn and IV clindamycin   Subjective: Patient reports significant improvement in right neck swelling and right jaw pain.  Patient states he is able to open his mouth more and was able to tolerate a liquid diet this morning.  He denies any fevers, chills, nausea, or vomiting.  Objective: Vitals:   08/10/19 2245 08/10/19 2255 08/10/19 2319 08/11/19 0541  BP:  118/80 131/69 96/65  Pulse: 88 82 74 61  Resp: 19 (!) 25 18 18   Temp:  99.5 F (37.5 C) 99.3 F (37.4 C) 98.9 F (37.2 C)  TempSrc:   Oral Oral  SpO2: 95% 98% 100% 99%  Weight:      Height:        Intake/Output Summary (Last 24 hours) at 08/11/2019 1400 Last data filed at 08/11/2019 1000 Gross per 24 hour  Intake 2186.36 ml  Output 470 ml  Net 1716.36 ml   Filed Weights   08/10/19 1820  Weight: 62 kg    Examination:  General exam: Appears calm and comfortable  ENT: Patient able to open jaw slightly with evidence of small residual pus in right parapharyngeal area.  Dressing on right lateral neck is clean dry and intact.  Right jaw mildly tender to palpation. Respiratory system: Clear to auscultation. Respiratory effort normal. Cardiovascular system: S1 & S2 heard, RRR. No JVD,  murmurs, rubs, gallops or clicks. No pedal edema. Gastrointestinal system: Abdomen is nondistended, soft and nontender. No organomegaly or masses felt. Normal bowel sounds heard. Central nervous system: Alert and oriented. No focal neurological deficits. Extremities: Symmetric 5 x 5 power. Skin: No rashes, lesions or ulcers   LOS: 1 day    Time spent: 35 mins  Charolotte Capuchin, MD [facility] Pager [phone]  If 7PM-7AM, please contact night-coverage [email or web address] Password [email or web address] 08/11/2019, 2:00 PM

## 2019-08-11 NOTE — Op Note (Signed)
[name], [name] MEDICAL RECORD [id number] ACCOUNT [id number] DATE OF BIRTH:[birth year] FACILITY: [facility] LOCATION: [facility] PHYSICIAN:Zanayah Shadowens Lincoln Maxin, MD  OPERATIVE REPORT  DATE OF PROCEDURE:  08/10/2019  PREOPERATIVE DIAGNOSIS:  Right parapharyngeal right neck abscess.  POSTOPERATIVE DIAGNOSIS:  Right parapharyngeal right neck abscess.  OPERATION PERFORMED:   1.  Incision and drainage of right neck abscess. 2.  Incision and drainage of intraoral abscess.  SURGEON:  Melony Overly, MD  ANESTHESIA:  General endotracheal.  COMPLICATIONS:  None.  BRIEF CLINICAL NOTE:  The patient is a 28 year old gentleman who had a wisdom tooth extracted about a week ago by a dentist.  He has progressively gotten worse, with increasing swelling and pain and difficulty swallowing.  He was subsequently admitted to  the ED where a CT scan showed a large abscess in the parapharyngeal space extending up on the soft palate, posterior right hypopharyngeal wall, as well as extending laterally into the neck.  He is taken to the operating room at this time for incision  and drainage of abscess, as well as IV antibiotics.  DESCRIPTION OF PROCEDURE:  After adequate endotracheal anesthesia, first the right neck was prepped and a small incision was made in the right neck.  Hemostat was extended down through a lot of cellulitic and edematous tissue and got very minimal pus  extending through the neck.  A quarter-inch Penrose drain was placed and then an intraoral approach was performed.  The abscess actually spontaneously extruded intraorally while I was doing the neck procedure.  This was suctioned and  purulence was  extruding from around the tonsil on the right side.  Cultures were obtained.  A small incision was made just above the right tonsil in the soft palate area.  A large abscess cavity was encountered.  This area was cultured, as well as irrigated with  200-300 mL of Bug Juice.  After irrigating  the abscess cavity, a quarter-inch Penrose drain was placed in the abscess cavity measuring approximately 4-5 cm in length.  It was secured to the soft palate with a single 3-0 silk suture.  In the area of  excision of the wisdom tooth, a the stitch was removed and the patient had some bleeding after removal of the stitch and this was controlled with suction cautery.  The patient had diffuse edema of the soft tissue around this area.  After draining the  abscess, an NG tube was placed transorally into the stomach and the stomach was aspirated and the procedure was completed.  The patient will be admitted for IV antibiotics and IV fluids.  He will be started on a liquid diet.  VN/NUANCE  D:08/10/2019 T:08/11/2019 JOB:[id number]

## 2019-08-11 NOTE — Consult Note (Signed)
1 day post op follow up of I&D parapharyngeal abscess Patient feeling much better  AF this am Neck swelling improved with minimal drainage from neck penrose drain. This was removed Cultures pending Will plan on removing palate oropharyngeal penrose drain tomorrow if he continues to do well. Could possibly discharge Thursday am if he continues to do well on po antibiotic

## 2019-08-11 NOTE — Anesthesia Postprocedure Evaluation (Signed)
Anesthesia Post Note  Patient: Darryl Lloyd  Procedure(s) Performed: INCISION AND DRAINAGE RIGHT NECK  ABSCESS, PHARYNGEAL ABSCESS (Right Neck)     Patient location during evaluation: PACU Anesthesia Type: General Level of consciousness: awake and alert Pain management: pain level controlled Vital Signs Assessment: post-procedure vital signs reviewed and stable Respiratory status: spontaneous breathing, nonlabored ventilation, respiratory function stable and patient connected to nasal cannula oxygen Cardiovascular status: blood pressure returned to baseline and stable Postop Assessment: no apparent nausea or vomiting Anesthetic complications: no    Last Vitals:  Vitals:   08/10/19 2319 08/11/19 0541  BP: 131/69 96/65  Pulse: 74 61  Resp: 18 18  Temp: 37.4 C 37.2 C  SpO2: 100% 99%    Last Pain:  Vitals:   08/11/19 0636  TempSrc:   PainSc: [place]

## 2019-08-12 LAB — BASIC METABOLIC PANEL
Anion gap: 7 (ref 5–15)
BUN: 11 mg/dL (ref 6–20)
CO2: 24 mmol/L (ref 22–32)
Calcium: 8.1 mg/dL — ABNORMAL LOW (ref 8.9–10.3)
Chloride: 106 mmol/L (ref 98–111)
Creatinine, Ser: 0.76 mg/dL (ref 0.61–1.24)
GFR calc Af Amer: >60 mL/min (ref >60)
GFR calc non Af Amer: >60 mL/min (ref >60)
Glucose, Bld: 99 mg/dL (ref 70–99)
Potassium: 3.9 mmol/L (ref 3.5–5.1)
Sodium: 137 mmol/L (ref 135–145)

## 2019-08-12 LAB — CBC
HCT: 36.6 % — ABNORMAL LOW (ref 39.0–52.0)
Hemoglobin: 12.3 g/dL — ABNORMAL LOW (ref 13.0–17.0)
MCH: 31 pg (ref 26.0–34.0)
MCHC: 33.6 g/dL (ref 30.0–36.0)
MCV: 92.2 fL (ref 80.0–100.0)
Platelets: 405 10*3/uL — ABNORMAL HIGH (ref 150–400)
RBC: 3.97 MIL/uL — ABNORMAL LOW (ref 4.22–5.81)
RDW: 12.6 % (ref 11.5–15.5)
WBC: 18.8 10*3/uL — ABNORMAL HIGH (ref 4.0–10.5)
nRBC: 0 % (ref 0.0–0.2)

## 2019-08-12 NOTE — Progress Notes (Signed)
  PROGRESS NOTE    Dominyck Reser  FAO:[id number] DOB: [birth year] DOA: 08/10/2019 PCP: Patient, No Pcp Per    Brief Narrative:  28 year old Hispanic male admitted with right neck swelling and jaw pain leading to difficulty opening mouth.  Patient noted to have right parapharyngeal abscess on CT imaging and is status post I&D by ENT on 08/10/2019.   Assessment & Plan:   Right parapharyngeal abscess: Status post incision and drainage by ENT on 08/10/2019 -Continue IV Unasyn and IV clindamycin per ENT -Follow drain output and further management of drain per ENT -If not better by tomorrow then will consider repeat CT imaging of the area and an ID consult   DVT prophylaxis: Lovenox SQ Code Status:Full code   Consultants:   ENT  Procedures:   Incision and drainage on 08/10/2019 by ENT of right parapharyngeal abscess  Antimicrobials:  IV Unasyn and IV clindamycin   Subjective: Patient reports feeling better than yesterday but still endorses pain in the right jaw and difficulty opening his mouth.  But he states that he was able to tolerate breakfast this morning.  He denies any fevers, chills, nausea, or vomiting.  Objective: Vitals:   08/11/19 1519 08/11/19 2140 08/12/19 0419 08/12/19 1322  BP: 106/64 107/70 117/72 120/78  Pulse: 67 61 (!) 52 (!) 52  Resp: 20 16 18 17   Temp: 99.8 F (37.7 C) 99.1 F (37.3 C) 97.9 F (36.6 C) 99.3 F (37.4 C)  TempSrc: Oral Oral Oral Oral  SpO2: 99% 100% 100% 100%  Weight:      Height:        Intake/Output Summary (Last 24 hours) at 08/12/2019 1533 Last data filed at 08/12/2019 1153 Gross per 24 hour  Intake 1100 ml  Output -  Net 1100 ml   Filed Weights   08/10/19 1820  Weight: 62 kg    Examination:  General exam: Appears calm and comfortable  ENT: Patient able to open jaw slightly but not able to visualize the right parapharyngeal area.  Dressing on right lateral neck is clean dry and intact.  Right jaw mildly tender to  palpation. Respiratory system: Clear to auscultation. Respiratory effort normal. Cardiovascular system: S1 & S2 heard, RRR. No JVD, murmurs, rubs, gallops or clicks. No pedal edema. Gastrointestinal system: Abdomen is nondistended, soft and nontender. No organomegaly or masses felt. Normal bowel sounds heard. Central nervous system: Alert and oriented. No focal neurological deficits. Extremities: Symmetric 5 x 5 power. Skin: No rashes, lesions or ulcers   LOS: 2 days    Time spent: 25 mins  Charolotte Capuchin, MD [facility] Pager [phone]  If 7PM-7AM, please contact night-coverage [email or web address] Password [email or web address] 08/12/2019, 3:33 PM

## 2019-08-12 NOTE — Progress Notes (Signed)
POD 2 from drainage of parapharyngeal abscess Culture grew G - rods, G+ cocci and G+ rods Patient presently on unasyn and clindamycin He's complaining of feeling worse today with increasing pain No increase swelling in neck but slightly more painful to exam Oral exam with out increase swelling but difficult to see well secondary to trismus. Penrose drain in palate intact. Patient concerning for worsening infection. If not feeling better tomorrow AM I would repeat CT scan of neck Final culture report pending. Could consult ID concerning best antibiotic therapy for the patient

## 2019-08-12 NOTE — Plan of Care (Signed)
  Problem: Education: Goal: Knowledge of General Education information will improve Description Including pain rating scale, medication(s)/side effects and non-pharmacologic comfort measures Outcome: Progressing   

## 2019-08-12 NOTE — Plan of Care (Signed)

## 2019-08-13 ENCOUNTER — Inpatient Hospital Stay (HOSPITAL_COMMUNITY): Payer: Self-pay

## 2019-08-13 LAB — BASIC METABOLIC PANEL
Anion gap: 10 (ref 5–15)
BUN: 7 mg/dL (ref 6–20)
CO2: 22 mmol/L (ref 22–32)
Calcium: 8.5 mg/dL — ABNORMAL LOW (ref 8.9–10.3)
Chloride: 103 mmol/L (ref 98–111)
Creatinine, Ser: 0.72 mg/dL (ref 0.61–1.24)
GFR calc Af Amer: >60 mL/min (ref >60)
GFR calc non Af Amer: >60 mL/min (ref >60)
Glucose, Bld: 103 mg/dL — ABNORMAL HIGH (ref 70–99)
Potassium: 3.8 mmol/L (ref 3.5–5.1)
Sodium: 135 mmol/L (ref 135–145)

## 2019-08-13 LAB — CBC
HCT: 38.6 % — ABNORMAL LOW (ref 39.0–52.0)
Hemoglobin: 12.8 g/dL — ABNORMAL LOW (ref 13.0–17.0)
MCH: 30.5 pg (ref 26.0–34.0)
MCHC: 33.2 g/dL (ref 30.0–36.0)
MCV: 92.1 fL (ref 80.0–100.0)
Platelets: 437 10*3/uL — ABNORMAL HIGH (ref 150–400)
RBC: 4.19 MIL/uL — ABNORMAL LOW (ref 4.22–5.81)
RDW: 12.5 % (ref 11.5–15.5)
WBC: 7.5 10*3/uL (ref 4.0–10.5)
nRBC: 0 % (ref 0.0–0.2)

## 2019-08-13 MED ORDER — IOHEXOL 300 MG/ML  SOLN
75.0000 mL | Freq: Once | INTRAMUSCULAR | Status: AC | PRN
  Administered 2019-08-13: 75 mL via INTRAVENOUS

## 2019-08-13 NOTE — Progress Notes (Signed)
  PROGRESS NOTE    Darryl Lloyd  ZHY:[id number] DOB: [birth year] DOA: 08/10/2019 PCP: Patient, No Pcp Per    Brief Narrative:  28 year old Hispanic male admitted with right neck swelling and jaw pain leading to difficulty opening mouth.  Patient noted to have right parapharyngeal abscess on CT imaging and is status post I&D by ENT on 08/10/2019.   Assessment & Plan:   Right parapharyngeal abscess: Status post incision and drainage by ENT on 08/10/2019 -Continue IV Unasyn and IV clindamycin per ENT -Drain management per ENT - repeat CT imaging of the area; consider ID consult if not better by tomorrow  DVT prophylaxis: Lovenox SQ Code Status:Full code  Consultants:   ENT  Procedures:   Incision and drainage on 08/10/2019 by ENT of right parapharyngeal abscess  Antimicrobials:  IV Unasyn and IV clindamycin  Subjective: Patient states that he actually feels much better than yesterday and was able to tolerate breakfast today.  He states he did not eat or drink much of anything yesterday due to pain in his right jaw and neck.  Objective: Vitals:   08/12/19 1322 08/12/19 2144 08/13/19 0555 08/13/19 1405  BP: 120/78 109/81 109/76 113/72  Pulse: (!) 52 (!) 56 (!) 54 71  Resp: 17 19 17 16   Temp: 99.3 F (37.4 C) 98.9 F (37.2 C) 99 F (37.2 C) 98 F (36.7 C)  TempSrc: Oral Oral Oral Oral  SpO2: 100% 99% 100% 100%  Weight:      Height:        Intake/Output Summary (Last 24 hours) at 08/13/2019 1625 Last data filed at 08/13/2019 1500 Gross per 24 hour  Intake 3595.41 ml  Output 0 ml  Net 3595.41 ml   Filed Weights   08/10/19 1820  Weight: 62 kg    Examination:  General exam: Appears calm and comfortable  ENT: Patient able to open jaw slightly but not able to visualize the right parapharyngeal area. Right jaw mildly tender to palpation. Respiratory system: Clear to auscultation. Respiratory effort normal. Cardiovascular system: S1 & S2 heard, RRR. No JVD, murmurs,  rubs, gallops or clicks. No pedal edema. Gastrointestinal system: Abdomen is nondistended, soft and nontender. No organomegaly or masses felt. Normal bowel sounds heard. Central nervous system: Alert and oriented. No focal neurological deficits. Extremities: Symmetric 5 x 5 power. Skin: No rashes, lesions or ulcers   LOS: 3 days    Time spent: 25 mins  Charolotte Capuchin, MD [facility] Pager [phone]  If 7PM-7AM, please contact night-coverage [email or web address] Password [facility] 08/13/2019, 4:25 PM

## 2019-08-13 NOTE — Progress Notes (Signed)
3 days s/p I&D right parapharyngeal aaabscess AF,  WBC down this AM to 8000 Patient exam still with mild swelling right cheek but very painful to palpation Inrtaoral exam with much less swelling but still with trismus and c/o of sore throat Penrose drain was removed from the right  peritonsillar region Would recommend CT Scan of neck to evaluate for any increasing abscess Continue with IV abx Final results of culture pending. Consider ID consult for appropriate abx recommendations

## 2019-08-14 NOTE — Progress Notes (Signed)
  PROGRESS NOTE    Darryl Lloyd  [id number]:[id number] DOB: Dec 19, 1990 DOA: 08/10/2019 PCP: Patient, No Pcp Per    Brief Narrative:  28 year old Hispanic male admitted with right neck swelling and jaw pain leading to difficulty opening mouth.  Patient noted to have right parapharyngeal abscess on CT imaging and is status post I&D by ENT on 08/10/2019.  Repeat CT scan from 08/13/2019 does show improvement however pain patient continues to have pain and difficulty opening his mouth more than baseline.  He is making progress but will likely benefit from another 1 to 2 days of IV antibiotics before completing oral antibiotic course at home.   Assessment & Plan:   Right parapharyngeal abscess: Status post incision and drainage by ENT on 08/10/2019 -Continue IV Unasyn and IV clindamycin per ENT -Drain management per ENT - repeat CT imaging of the area with significant improvement and no need for further surgical intervention but will require continued antibiotic therapy -He is making progress but will likely benefit from another 1 to 2 days of IV antibiotics before completing oral antibiotic course at home.  DVT prophylaxis: Lovenox SQ Code Status:Full code  Consultants:   ENT  Procedures:   Incision and drainage on 08/10/2019 by ENT of right parapharyngeal abscess  Antimicrobials:  IV Unasyn and IV clindamycin  Subjective: Patient with continued improvement with each passing day and is able to tolerate a liquid diet which will be advanced later today and we will see how patient responds.  He denies any fevers, chills, nausea, vomiting, diarrhea.  Objective: Vitals:   08/13/19 1405 08/13/19 2023 08/14/19 0506 08/14/19 1419  BP: 113/72 110/72 119/76 111/82  Pulse: 71 60 64 70  Resp: 16 16 17 17   Temp: 98 F (36.7 C) 98.9 F (37.2 C) 98.7 F (37.1 C) 98.7 F (37.1 C)  TempSrc: Oral Oral Oral Oral  SpO2: 100% 100% 99% 99%  Weight:      Height:        Intake/Output Summary (Last 24  hours) at 08/14/2019 1647 Last data filed at 08/14/2019 1052 Gross per 24 hour  Intake 450 ml  Output -  Net 450 ml   Filed Weights   08/10/19 1820  Weight: 62 kg    Examination:  General exam: Appears calm and comfortable  ENT: Patient able to open jaw slightly but not able to visualize the right parapharyngeal area. Right jaw mildly tender to palpation in the parotid region Respiratory system: Clear to auscultation. Respiratory effort normal. Cardiovascular system: S1 & S2 heard, RRR. No JVD, murmurs, rubs, gallops or clicks. No pedal edema. Gastrointestinal system: Abdomen is nondistended, soft and nontender. No organomegaly or masses felt. Normal bowel sounds heard. Central nervous system: Alert and oriented. No focal neurological deficits. Extremities: Symmetric 5 x 5 power. Skin: No rashes, lesions or ulcers   LOS: 4 days    Time spent: 25 mins  Charolotte Capuchin, MD [facility] Pager [phone]  If 7PM-7AM, please contact night-coverage [email or web address] Password [facility] 08/14/2019, 4:47 PM

## 2019-08-14 NOTE — Progress Notes (Signed)
POD 4 s/p I&D right parapharyngeal abscess CT scan from yesterday looks much better but still with a lot of inflammation of right neck and right parotid region which is causing patient pain. Nothing really to drain and should respond to appropriate antibiotic therapy which he is receiving. AF  Neck looks OK but still complains of pain Patient asking for normal diet. Looking better overall Recommendation I increased diet to regular diet and encouraged patient to drink a lot of liquids. IV antibiotics until patient comfortable to go home on po antibiotics for 7-10 days If signs or symptoms worsen may need repeat CT scan in 2-3 days I will be out of town this weekend but can be reached on my cell phone if needed   [phone]

## 2019-08-15 DIAGNOSIS — L0211 Cutaneous abscess of neck: Secondary | ICD-10-CM

## 2019-08-15 LAB — AEROBIC/ANAEROBIC CULTURE W GRAM STAIN (SURGICAL/DEEP WOUND)

## 2019-08-15 LAB — CULTURE, BLOOD (ROUTINE X 2)
Culture: NO GROWTH
Culture: NO GROWTH
Special Requests: ADEQUATE

## 2019-08-15 LAB — CBC WITH DIFFERENTIAL/PLATELET
Abs Immature Granulocytes: 0.2 10*3/uL — ABNORMAL HIGH (ref 0.00–0.07)
Basophils Absolute: 0.1 10*3/uL (ref 0.0–0.1)
Basophils Relative: 1 %
Eosinophils Absolute: 0.4 10*3/uL (ref 0.0–0.5)
Eosinophils Relative: 5 %
HCT: 41 % (ref 39.0–52.0)
Hemoglobin: 13.7 g/dL (ref 13.0–17.0)
Lymphocytes Relative: 37 %
Lymphs Abs: 2.7 10*3/uL (ref 0.7–4.0)
MCH: 30.7 pg (ref 26.0–34.0)
MCHC: 33.4 g/dL (ref 30.0–36.0)
MCV: 91.9 fL (ref 80.0–100.0)
Metamyelocytes Relative: 1 %
Monocytes Absolute: 0.5 10*3/uL (ref 0.1–1.0)
Monocytes Relative: 7 %
Myelocytes: 2 %
Neutro Abs: 3.4 10*3/uL (ref 1.7–7.7)
Neutrophils Relative %: 47 %
Platelets: 610 10*3/uL — ABNORMAL HIGH (ref 150–400)
RBC: 4.46 MIL/uL (ref 4.22–5.81)
RDW: 12.3 % (ref 11.5–15.5)
WBC: 7.3 10*3/uL (ref 4.0–10.5)
nRBC: 0 % (ref 0.0–0.2)
nRBC: 0 /100 WBC

## 2019-08-15 NOTE — Progress Notes (Addendum)
PROGRESS NOTE  Sasha Rueth [id number] DOB: Jul 01, 1991 DOA: 08/10/2019 PCP: Patient, No Pcp Per  HPI/Recap of past 36 hours:  28 year old Hispanic male admitted with right neck swelling and jaw pain leading to difficulty opening his mouth patient noted to have had right parapharyngeal abscess on CT imaging and is status post I&D by ENT on August 10, 2019.  Repeat CT scan from August 13, 2019 does show some improvement however he still continues to have pain and difficulty opening his mouth.  He is currently on antibiotics he is making progress and he is going to have one to another day of IV antibiotics today which will be switched to p.o. and discharge home tomorrow likely Patient seen and examined at bedside.  He denies fever he still has some pain on the right neck area at the incision site.  Assessment/Plan: Principal Problem:   Abscess of neck Active Problems:   Acute mastoiditis of right side with abscess of neck   Neck abscess    1.  Right parapharyngeal abscess status post incision and drainage by ENT on August 10, 2019 drain is out there is no drainage at this time still has some tenderness will continue to complete antibiotics possible discharge tomorrow  2.  Leukocytosis resolved with antibiotics   Code Status: Full  Family Communication: None at bedside  Disposition Plan: Home in 1 to 2 days   Consultants:  ENT  Procedures:  I&D  Antimicrobials:  Unasyn and clindamycin IV  DVT prophylaxis: Lovenox   Objective: Vitals:   08/14/19 2013 08/15/19 0625  BP: 102/74 106/70  Pulse: 78 (!) 59  Resp: 17 17  Temp: 98.5 F (36.9 C) 98.2 F (36.8 C)  SpO2: 100% 100%    Intake/Output Summary (Last 24 hours) at 08/15/2019 0824 Last data filed at 08/14/2019 1753 Gross per 24 hour  Intake 1330 ml  Output -  Net 1330 ml   Filed Weights   08/10/19 1820  Weight: 62 kg   Body mass index is 19.06 kg/m.  Exam:   General: Calm normal body habitus  not in pain  HEENT: Right neck has an incision point with no sutures there is no draining there is still some tenderness around it with no redness  Cardiovascular: Regular rate and rhythm no murmur  Respiratory: Unlabored even respiration clear to auscultation bilaterally  Abdomen: Soft nontender  Musculoskeletal: Moves all extremities  Skin: Tattoo no rash  Psychiatry: Pleasant alert and oriented x3   Data Reviewed: CBC: Recent Labs  Lab 08/10/19 1313 08/10/19 1437 08/11/19 0248 08/12/19 0216 08/13/19 0127  WBC 25.6*  --  27.7* 18.8* 7.5  NEUTROABS 22.9*  --   --   --   --   HGB 14.6 15.6 12.5* 12.3* 12.8*  HCT 43.6 46.0 37.4* 36.6* 38.6*  MCV 92.4  --  92.8 92.2 92.1  PLT 458*  --  427* 405* 437*   Basic Metabolic Panel: Recent Labs  Lab 08/10/19 1437 08/11/19 0248 08/12/19 0216 08/13/19 0127  NA 134* 136 137 135  K 3.8 4.1 3.9 3.8  CL 95* 101 106 103  CO2  --  GLUCOSE 109* 105* 99 103*  BUN CREATININE 0.80 0.85 0.76 0.72  CALCIUM  --  8.4* 8.1* 8.5*   GFR: Estimated Creatinine Clearance: 120.6 mL/min (by C-G formula based on SCr of 0.72 mg/dL). Liver Function Tests: No results for input(s): AST, ALT, ALKPHOS, BILITOT, PROT, ALBUMIN in the  last 168 hours. No results for input(s): LIPASE, AMYLASE in the last 168 hours. No results for input(s): AMMONIA in the last 168 hours. Coagulation Profile: No results for input(s): INR, PROTIME in the last 168 hours. Cardiac Enzymes: No results for input(s): CKTOTAL, CKMB, CKMBINDEX, TROPONINI in the last 168 hours. BNP (last 3 results) No results for input(s): PROBNP in the last 8760 hours. HbA1C: No results for input(s): HGBA1C in the last 72 hours. CBG: No results for input(s): GLUCAP in the last 168 hours. Lipid Profile: No results for input(s): CHOL, HDL, LDLCALC, TRIG, CHOLHDL, LDLDIRECT in the last 72 hours. Thyroid Function Tests: No results for input(s): TSH, T4TOTAL, FREET4,  T3FREE, THYROIDAB in the last 72 hours. Anemia Panel: No results for input(s): VITAMINB12, FOLATE, FERRITIN, TIBC, IRON, RETICCTPCT in the last 72 hours. Urine analysis:    Component Value Date/Time   COLORURINE YELLOW 08/10/2019 1840   APPEARANCEUR CLEAR 08/10/2019 1840   LABSPEC >1.046 (H) 08/10/2019 1840   PHURINE 6.0 08/10/2019 1840   GLUCOSEU NEGATIVE 08/10/2019 1840   HGBUR NEGATIVE 08/10/2019 1840   BILIRUBINUR NEGATIVE 08/10/2019 1840   KETONESUR 20 (A) 08/10/2019 1840   PROTEINUR NEGATIVE 08/10/2019 1840   UROBILINOGEN 0.2 06/22/2010 1119   NITRITE NEGATIVE 08/10/2019 1840   LEUKOCYTESUR NEGATIVE 08/10/2019 1840   Sepsis Labs: @LABRCNTIP (procalcitonin:4,lacticidven:4)  ) Recent Results (from the past 240 hour(s))  Blood culture (routine x 2)     Status: None   Collection Time: 08/10/19  1:20 PM   Specimen: BLOOD LEFT FOREARM  Result Value Ref Range Status   Specimen Description BLOOD LEFT FOREARM  Final   Special Requests   Final    BOTTLES DRAWN AEROBIC AND ANAEROBIC Blood Culture results may not be optimal due to an inadequate volume of blood received in culture bottles   Culture   Final    NO GROWTH 5 DAYS Performed at [facility], [address] [place], [place]    Report Status 08/15/2019 FINAL  Final  Blood culture (routine x 2)     Status: None   Collection Time: 08/10/19  1:30 PM   Specimen: BLOOD LEFT WRIST  Result Value Ref Range Status   Specimen Description BLOOD LEFT WRIST  Final   Special Requests   Final    BOTTLES DRAWN AEROBIC AND ANAEROBIC Blood Culture adequate volume   Culture   Final    NO GROWTH 5 DAYS Performed at [facility], [address], [place], [place]    Report Status 08/15/2019 FINAL  Final  SARS Coronavirus 2 by RT PCR (hospital order, performed in [facility] hospital lab) Nasopharyngeal Nasopharyngeal Swab     Status: None   Collection Time: 08/10/19  6:15 PM   Specimen: Nasopharyngeal  Swab  Result Value Ref Range Status   SARS Coronavirus 2 NEGATIVE NEGATIVE Final    Comment: (NOTE) If result is NEGATIVE SARS-CoV-2 target nucleic acids are NOT DETECTED. The SARS-CoV-2 RNA is generally detectable in upper and lower  respiratory specimens during the acute phase of infection. The lowest  concentration of SARS-CoV-2 viral copies this assay can detect is 250  copies / mL. A negative result does not preclude SARS-CoV-2 infection  and should not be used as the sole basis for treatment or other  patient management decisions.  A negative result may occur with  improper specimen collection / handling, submission of specimen other  than nasopharyngeal swab, presence of viral mutation(s) within the  areas  targeted by this assay, and inadequate number of viral copies  (<250 copies / mL). A negative result must be combined with clinical  observations, patient history, and epidemiological information. If result is POSITIVE SARS-CoV-2 target nucleic acids are DETECTED. The SARS-CoV-2 RNA is generally detectable in upper and lower  respiratory specimens dur ing the acute phase of infection.  Positive  results are indicative of active infection with SARS-CoV-2.  Clinical  correlation with patient history and other diagnostic information is  necessary to determine patient infection status.  Positive results do  not rule out bacterial infection or co-infection with other viruses. If result is PRESUMPTIVE POSTIVE SARS-CoV-2 nucleic acids MAY BE PRESENT.   A presumptive positive result was obtained on the submitted specimen  and confirmed on repeat testing.  While 2019 novel coronavirus  (SARS-CoV-2) nucleic acids may be present in the submitted sample  additional confirmatory testing may be necessary for epidemiological  and / or clinical management purposes  to differentiate between  SARS-CoV-2 and other Sarbecovirus currently known to infect humans.  If clinically indicated  additional testing with an alternate test  methodology [phone] is advised. The SARS-CoV-2 RNA is generally  detectable in upper and lower respiratory sp ecimens during the acute  phase of infection. The expected result is Negative. Fact Sheet for Patients:  BoilerBrush.com.cyhttps://www.fda.gov/media/136312/download Fact Sheet for Healthcare Providers: https://pope.com/https://www.fda.gov/media/136313/download This test is not yet approved or cleared by the [place] FDA and has been authorized for detection and/or diagnosis of SARS-CoV-2 by FDA under an Emergency Use Authorization (EUA).  This EUA will remain in effect (meaning this test can be used) for the duration of the COVID-19 declaration under Section 564(b)(1) of the Act, 21 U.S.C. section 360bbb-3(b)(1), unless the authorization is terminated or revoked sooner. Performed at [facility], [address] [place], [place] [address]   Urine culture     Status: None   Collection Time: 08/10/19  6:45 PM   Specimen: In/Out Cath Urine  Result Value Ref Range Status   Specimen Description IN/OUT CATH URINE  Final   Special Requests NONE  Final   Culture   Final    NO GROWTH Performed at [facility], [address] [place], [place] [address]    Report Status 08/11/2019 FINAL  Final  Aerobic/Anaerobic Culture (surgical/deep wound)     Status: None (Preliminary result)   Collection Time: 08/10/19  9:42 PM   Specimen: Abscess  Result Value Ref Range Status   Specimen Description ABSCESS RIGHT NECK  Final   Special Requests NONE  Final   Gram Stain   Final    RARE WBC PRESENT,BOTH PMN AND MONONUCLEAR MODERATE GRAM NEGATIVE RODS FEW GRAM POSITIVE COCCI IN PAIRS MODERATE GRAM POSITIVE RODS Performed at [facility], [address] [place], [place] [address]    Culture   Final    FEW EIKENELLA CORRODENS Usually susceptible to penicillin and other beta lactam agents,quinolones,macrolides and tetracyclines. NO ANAEROBES ISOLATED;  CULTURE IN PROGRESS FOR 5 DAYS    Report Status PENDING  Incomplete    Sorted by update time Updated  Order  08/15/19 1331  Aerobic/Anaerobic Culture (surgical/deep wound)  Collected: 08/10/19 2142  Final result  Specimen: Abscess   Specimen Description ABSCESS RIGHT NECK Culture FEW EIKENELLA CORRODENS  Usually susceptible to penicillin and other beta lactam agents,quinolone...  Special Requests NONE Report Status 08/15/2019 FINAL  Gram Stain RARE WBC PRESENT,BOTH PMN AND MONONUCLEAR  MODERATE GRAM NEGATIVE RODS  FEW  GRAM POSITIVE COCCI I...         08/15/19 0658  Blood culture (routine x 2)  Collected: 08/10/19 1320  Final result  Specimen: Blood from BLOOD LEFT FOREARM   Specimen Description BLOOD LEFT FOREARM Culture NO GROWTH 5 DAYS  Performed at [facility], [place] [address], [place], [place] [address]   Special Requests BOTTLES DRAWN AEROBIC AND ANAEROBIC Blood Culture results may not be optimal due to an inadequate... Report Status 08/15/2019 FINAL       08/15/19 0658  Blood culture (routine x 2)  Collected: 08/10/19 1330  Final result  Specimen: Blood from BLOOD LEFT WRIST   Specimen Description BLOOD LEFT WRIST Culture NO GROWTH 5 DAYS  Performed at [facility] [address], [place], [place] [address]   Special Requests BOTTLES DRAWN AEROBIC AND ANAEROBIC Blood Culture adequate volume Report         Studies: No results found.  Scheduled Meds: . bacitracin  1 application Topical T1Z  . docusate sodium  100 mg Oral BID  . enoxaparin (LOVENOX) injection  40 mg Subcutaneous Q24H  . senna  1 tablet Oral Daily  . sodium chloride flush  3 mL Intravenous Q12H    Continuous Infusions: . sodium chloride    . ampicillin-sulbactam (UNASYN) IV 1.5 g (08/15/19 7356)  . clindamycin (CLEOCIN) IV 600 mg (08/15/19 0536)  . dextrose 5 % and 0.45 % NaCl with KCl 20 mEq/L 75 mL/hr at 08/15/19 0315  . dextrose 5 % and 0.9% NaCl       LOS: 5 days      Cristal Deer, MD [facility]  To reach me or the doctor on call, go to: [email or web address] Password [facility]  08/15/2019, 8:24 AM

## 2019-08-16 MED ORDER — BACITRACIN ZINC 500 UNIT/GM EX OINT: 1.0000 "application " | TOPICAL_OINTMENT | Freq: Three times a day (TID) | CUTANEOUS | 0 refills | Status: AC

## 2019-08-16 MED ORDER — AMOXICILLIN-POT CLAVULANATE 875-125 MG PO TABS: 1.0000 | ORAL_TABLET | Freq: Two times a day (BID) | ORAL | 0 refills | Status: AC

## 2019-08-16 NOTE — Discharge Summary (Signed)
Discharge Summary  Darryl Lloyd IHK:[id number] DOB: 08/21/1991  PCP: Patient, No Pcp Per  Admit date: 08/10/2019 Discharge date: 08/16/2019  Time spent: Less than 30 minutes  Recommendations for Outpatient Follow-up:  1. PCP  Discharge Diagnoses:  Active Hospital Problems   Diagnosis Date Noted   Abscess of neck 08/10/2019   Acute mastoiditis of right side with abscess of neck 08/10/2019   Neck abscess 08/10/2019    Resolved Hospital Problems  No resolved problems to display.    Discharge Condition: Improved  Diet recommendation: Regular  Vitals:   08/15/19 2111 08/16/19 0526  BP: 102/75 106/66  Pulse: 97 65  Resp: 18 15  Temp: 98.9 F (37.2 C) 98.6 F (37 C)  SpO2: 98% 100%    History of present illness:  28 year old Hispanic male admitted with right neck swelling and jaw pain leading to difficulty opening his mouth patient noted to have had right parapharyngeal abscess on CT imaging and is status post I&D by ENT on August 10, 2019.  Repeat CT scan from August 13, 2019 does show some improvement however he still continues to have pain and difficulty opening his mouth.  He is currently on antibiotics he is making progress and he is going to have one to another day of IV antibiotics today which will be switched to p.o. and discharge home tomorrow likely Patient seen and examined at bedside.  He denies fever he still has some pain on the right neck area at the incision site.   Hospital Course:  Principal Problem:   Abscess of neck Active Problems:   Acute mastoiditis of right side with abscess of neck   Neck abscess   Procedures:  I&D  Consultations:  ENT  Discharge Exam: BP 106/66 (BP Location: Left Arm)    Pulse 65    Temp 98.6 F (37 C) (Oral)    Resp 15    Ht 5\' 11"  (1.803 m)    Wt 62 kg    SpO2 100%    BMI 19.06 kg/m     General: Calm normal body habitus not in pain  HEENT: Right neck has an incision point with no sutures there is no  draining there is no tenderness and no redness  Cardiovascular: Regular rate and rhythm no murmur  Respiratory: Unlabored even respiration clear to auscultation bilaterally  Abdomen: Soft nontender  Musculoskeletal: Moves all extremities  Skin: Tattoo no rash Psychiatry: Pleasant alert and oriented x3   Discharge Instructions You were cared for by a hospitalist during your hospital stay. If you have any questions about your discharge medications or the care you received while you were in the hospital after you are discharged, you can call the unit and asked to speak with the hospitalist on call if the hospitalist that took care of you is not available. Once you are discharged, your primary care physician will handle any further medical issues. Please note that NO REFILLS for any discharge medications will be authorized once you are discharged, as it is imperative that you return to your primary care physician (or establish a relationship with a primary care physician if you do not have one) for your aftercare needs so that they can reassess your need for medications and monitor your lab values.  Discharge Instructions    Call MD for:  redness, tenderness, or signs of infection (pain, swelling, redness, odor or green/yellow discharge around incision site)   Complete by: As directed    Call MD for:  severe uncontrolled  pain   Complete by: As directed    Call MD for:  temperature >100.4   Complete by: As directed    Diet - low sodium heart healthy   Complete by: As directed    Discharge instructions   Complete by: As directed    MUST COMPLET  7 DAYS OF ORAL ANTIBIOTICS   Increase activity slowly   Complete by: As directed      Allergies as of 08/16/2019   No Known Allergies     Medication List    STOP taking these medications   amoxicillin 500 MG capsule Commonly known as: AMOXIL   methylPREDNISolone 4 MG Tbpk tablet Commonly known as: MEDROL DOSEPAK     TAKE these  medications   amoxicillin-clavulanate 875-125 MG tablet Commonly known as: Augmentin Take 1 tablet by mouth every 12 (twelve) hours for 7 days.   bacitracin ointment Apply 1 application topically every 8 (eight) hours.   HYDROcodone-acetaminophen 5-325 MG tablet Commonly known as: NORCO/VICODIN Take 1 tablet by mouth every 4 (four) hours as needed for pain.      No Known Allergies    The results of significant diagnostics from this hospitalization (including imaging, microbiology, ancillary and laboratory) are listed below for reference.    Significant Diagnostic Studies: Ct Soft Tissue Neck W Contrast  Result Date: 08/13/2019 CLINICAL DATA:  28 year old male status post surgical drainage of large trans spatial neck abscess diagnosed by CT on 08/10/2019. EXAM: CT NECK WITH CONTRAST TECHNIQUE: Multidetector CT imaging of the neck was performed using the standard protocol following the bolus administration of intravenous contrast. CONTRAST:  75 milliliters Omnipaque 300. COMPARISON:  Pretreatment CT 08/10/2019. FINDINGS: Pharynx and larynx: Substantially regressed abnormal right pharyngeal and parapharyngeal space soft tissue swelling and inflammation. No significant mass effect on the airway now. The left parapharyngeal space remains normal. The larynx is normal. The retropharyngeal spaces normalized. Surgical drains have recently been removed from the large trans spatial right pharyngeal and parapharyngeal neck abscess. There is a residual gas containing right palatine tonsil cavity on series 3, image 30. There remains a trace amount of fluid and gas tracking from this cavity laterally anterior to the styloid process into the right parotid gland. But parapharyngeal and parotid space gas and fluid have also substantially decreased. Furthermore, the lateral extension of the abscess was also drained to the skin as seen on series 3, image 44, which still appears to be in communication with some of  the residual gas and fluid. There is residual gas and fluid both within the right parotid space and also the superior right masticator space adjacent to the right mandible on series 3, image 21. The right parotid gland is asymmetrically enlarged and hyperenhancing. The right masticator space and submandibular spaces also appear improved. Salivary glands: Right parotid discussed above. The submandibular glands now have a relatively symmetric appearance. Negative sublingual space. The left parotid gland remains normal. Thyroid: Negative. Lymph nodes: Decreased lymph node enhancement. No new or progressive lymphadenopathy. Vascular: The major vascular structures in the neck and at the skull base remain patent, including the right IJ. Limited intracranial: Negative. Visualized orbits: Negative. Mastoids and visualized paranasal sinuses: Well pneumatized aside from mucosal thickening in the left frontal sinus. Skeleton: Carious dentition. Right mandible remains intact. No new osseous abnormality identified. Upper chest: Visible mediastinum remains normal. Visible upper lungs are stable, left upper lobe scarring suspected on series 3, image 92. IMPRESSION: 1. Substantially improved large multi-spatial odontogenic right neck abscess following surgical  drainage both from the pharynx and from the skin surface. Small volume residual gas and fluid within the affected areas, perhaps most notably now in the right parotid space and adjacent to the right mandible ramus, with associated acute right parotiditis. 2. Study was reviewed in person with Dr. Narda Bonds on 08/13/2019 at 1734 hours. The patient will be continued on IV antibiotics. He and I discussed repeat Neck CT with IV contrast if the patient's condition worsens, or after several more days if his right parotid region pain persists. Electronically Signed   By: Odessa Fleming M.D.   On: 08/13/2019 18:23   Ct Soft Tissue Neck W Contrast  Result Date: 08/10/2019 CLINICAL DATA:   28 year old male with recent wisdom tooth extraction and right lower jaw swelling. EXAM: CT NECK WITH CONTRAST TECHNIQUE: Multidetector CT imaging of the neck was performed using the standard protocol following the bolus administration of intravenous contrast. CONTRAST:  26mL OMNIPAQUE IOHEXOL 300 MG/ML  SOLN COMPARISON:  None. FINDINGS: Pharynx and larynx: Abnormal pharynx with a large gas and fluid containing multi spatial abscess involving the right tonsil (series 3, image 23). The tonsillar component of the infected collection is roughly 36 x 39 x 45 millimeters (AP by transverse by CC), estimated at 32 milliliters. The abnormality tracks through the right parapharyngeal space into the right masticator and submandibular spaces. Severe mass effect on the pharyngeal airway which is shifted to the left. The left parapharyngeal space remains normal. There is a retropharyngeal effusion, but no gas in the retropharyngeal space to strongly suggest communication with the large abscess. Gas distended hypopharynx with motion artifact there and at the larynx which seem to remain normal. Salivary glands: Grossly negative sublingual space. Submandibular glands are symmetrically enhancing. The trans spatial right side abscess extends into the right parotid space from the adjacent right masticator space, containing air in fluid. The left parotid remains normal. Thyroid: Negative. Lymph nodes: Reactive appearing right level 2A lymph node measures about 16 millimeter short axis. Vascular: Motion artifact, but the major vascular structures in the neck and at the skull base seem to remain patent-although the superior right IJ is completely effaced and the transposed Scholl abscess directly abuts the right carotid space on series 3, image 16. Limited intracranial: Negative. Visualized orbits: Partially visible, negative. Mastoids and visualized paranasal sinuses: Only trace paranasal sinus mucosal thickening. Visible right  tympanic cavity and mastoid are clear. Skeleton: Extracted right mandible wisdom tooth but with cortical breakthrough through the medial inferior mandible as seen on series 5, image 37 and sagittal image 40. Carious nearby right maxillary wisdom tooth. Carious contralateral left side wisdom teeth. The remainder of the right mandible is intact. The right TMJ is normally located. No other acute osseous abnormality identified. Upper chest: Negative trachea and other visible major airways. The visible upper lungs are clear aside from what appears to be an area of mild left upper lobe scarring on series 5, image 90. Normal superior mediastinum. IMPRESSION: 1. Severe multi-spatial Right Neck Abscess containing air and fluid and with a bulky 32 mL right tonsillar component. Involvement of the right: Parapharyngeal, CAROTID, masticator, submandibular, and parotid spaces. Effaced but not thrombosed Right IJ. Retropharyngeal space relatively spared at this time; small retropharyngeal effusion. Sublingual space appears spared. 2. Narrowing of the oropharyngeal airway. Negative visible mediastinum and upper chest. Study discussed by telephone with PA-C CORTNI COUTURE on 08/10/2019 at 16:14 . Electronically Signed   By: Odessa Fleming M.D.   On: 08/10/2019 16:17  Microbiology: Recent Results (from the past 240 hour(s))  Blood culture (routine x 2)     Status: None   Collection Time: 08/10/19  1:20 PM   Specimen: BLOOD LEFT FOREARM  Result Value Ref Range Status   Specimen Description BLOOD LEFT FOREARM  Final   Special Requests   Final    BOTTLES DRAWN AEROBIC AND ANAEROBIC Blood Culture results may not be optimal due to an inadequate volume of blood received in culture bottles   Culture   Final    NO GROWTH 5 DAYS Performed at [facility], [place] [address] [place], [place] [address]    Report Status 08/15/2019 FINAL  Final  Blood culture (routine x 2)     Status: None   Collection Time: 08/10/19  1:30 PM    Specimen: BLOOD LEFT WRIST  Result Value Ref Range Status   Specimen Description BLOOD LEFT WRIST  Final   Special Requests   Final    BOTTLES DRAWN AEROBIC AND ANAEROBIC Blood Culture adequate volume   Culture   Final    NO GROWTH 5 DAYS Performed at [facility], [place] [address], [place], [place] [address]    Report Status 08/15/2019 FINAL  Final  SARS Coronavirus 2 by RT PCR (hospital order, performed in [facility] hospital lab) Nasopharyngeal Nasopharyngeal Swab     Status: None   Collection Time: 08/10/19  6:15 PM   Specimen: Nasopharyngeal Swab  Result Value Ref Range Status   SARS Coronavirus 2 NEGATIVE NEGATIVE Final    Comment: (NOTE) If result is NEGATIVE SARS-CoV-2 target nucleic acids are NOT DETECTED. The SARS-CoV-2 RNA is generally detectable in upper and lower  respiratory specimens during the acute phase of infection. The lowest  concentration of SARS-CoV-2 viral copies this assay can detect is 250  copies / mL. A negative result does not preclude SARS-CoV-2 infection  and should not be used as the sole basis for treatment or other  patient management decisions.  A negative result may occur with  improper specimen collection / handling, submission of specimen other  than nasopharyngeal swab, presence of viral mutation(s) within the  areas targeted by this assay, and inadequate number of viral copies  (<250 copies / mL). A negative result must be combined with clinical  observations, patient history, and epidemiological information. If result is POSITIVE SARS-CoV-2 target nucleic acids are DETECTED. The SARS-CoV-2 RNA is generally detectable in upper and lower  respiratory specimens dur ing the acute phase of infection.  Positive  results are indicative of active infection with SARS-CoV-2.  Clinical  correlation with patient history and other diagnostic information is  necessary to determine patient infection status.  Positive results do  not rule out  bacterial infection or co-infection with other viruses. If result is PRESUMPTIVE POSTIVE SARS-CoV-2 nucleic acids MAY BE PRESENT.   A presumptive positive result was obtained on the submitted specimen  and confirmed on repeat testing.  While 2019 novel coronavirus  (SARS-CoV-2) nucleic acids may be present in the submitted sample  additional confirmatory testing may be necessary for epidemiological  and / or clinical management purposes  to differentiate between  SARS-CoV-2 and other Sarbecovirus currently known to infect humans.  If clinically indicated additional testing with an alternate test  methodology [id number]) is advised. The SARS-CoV-2 RNA is generally  detectable in upper and lower respiratory sp ecimens during the acute  phase of infection. The expected result is Negative. Fact Sheet for Patients:  StrictlyIdeas.no  Fact Sheet for Healthcare Providers: https://pope.com/https://www.fda.gov/media/136313/download This test is not yet approved or cleared by the [place] FDA and has been authorized for detection and/or diagnosis of SARS-CoV-2 by FDA under an Emergency Use Authorization (EUA).  This EUA will remain in effect (meaning this test can be used) for the duration of the COVID-19 declaration under Section 564(b)(1) of the Act, 21 U.S.C. section 360bbb-3(b)(1), unless the authorization is terminated or revoked sooner. Performed at [facility], [address] [place], [place] [address]   Urine culture     Status: None   Collection Time: 08/10/19  6:45 PM   Specimen: In/Out Cath Urine  Result Value Ref Range Status   Specimen Description IN/OUT CATH URINE  Final   Special Requests NONE  Final   Culture   Final    NO GROWTH Performed at [facility], [address] [place], [place] [address]    Report Status 08/11/2019 FINAL  Final  Aerobic/Anaerobic Culture (surgical/deep wound)     Status: None   Collection Time: 08/10/19  9:42 PM    Specimen: Abscess  Result Value Ref Range Status   Specimen Description ABSCESS RIGHT NECK  Final   Special Requests NONE  Final   Gram Stain   Final    RARE WBC PRESENT,BOTH PMN AND MONONUCLEAR MODERATE GRAM NEGATIVE RODS FEW GRAM POSITIVE COCCI IN PAIRS MODERATE GRAM POSITIVE RODS    Culture   Final    FEW EIKENELLA CORRODENS Usually susceptible to penicillin and other beta lactam agents,quinolones,macrolides and tetracyclines. NO ANAEROBES ISOLATED Performed at [facility], [address] [place], [place] [address]    Report Status 08/15/2019 FINAL  Final     Labs: Basic Metabolic Panel: Recent Labs  Lab 08/10/19 1437 08/11/19 0248 08/12/19 0216 08/13/19 0127  NA 134* 136 137 135  K 3.8 4.1 3.9 3.8  CL 95* 101 106 103  CO2  --  24 24 22   GLUCOSE 109* 105* 99 103*  BUN 17 12 11 7   CREATININE 0.80 0.85 0.76 0.72  CALCIUM  --  8.4* 8.1* 8.5*   Liver Function Tests: No results for input(s): AST, ALT, ALKPHOS, BILITOT, PROT, ALBUMIN in the last 168 hours. No results for input(s): LIPASE, AMYLASE in the last 168 hours. No results for input(s): AMMONIA in the last 168 hours. CBC: Recent Labs  Lab 08/10/19 1313 08/10/19 1437 08/11/19 0248 08/12/19 0216 08/13/19 0127 08/15/19 2055  WBC 25.6*  --  27.7* 18.8* 7.5 7.3  NEUTROABS 22.9*  --   --   --   --  3.4  HGB 14.6 15.6 12.5* 12.3* 12.8* 13.7  HCT 43.6 46.0 37.4* 36.6* 38.6* 41.0  MCV 92.4  --  92.8 92.2 92.1 91.9  PLT 458*  --  427* 405* 437* 610*   Cardiac Enzymes: No results for input(s): CKTOTAL, CKMB, CKMBINDEX, TROPONINI in the last 168 hours. BNP: BNP (last 3 results) No results for input(s): BNP in the last 8760 hours.  ProBNP (last 3 results) No results for input(s): PROBNP in the last 8760 hours.  CBG: No results for input(s): GLUCAP in the last 168 hours.     Signed:  Myrtie NeitherNwannadiya Gia Lusher, MD [facility] 08/16/2019, 12:28 PM

## 2019-08-16 NOTE — Care Management (Signed)
Per RN, patient is discharged and did not express any concern over paying for prescriptions.  [facility] program is available if needed.  No answer in patient's room or mobile phone.

## 2019-08-16 NOTE — Discharge Planning (Deleted)
Discharge Summary  Darryl Lloyd IHK:[id number] DOB: 08/21/1991  PCP: Patient, No Pcp Per  Admit date: 08/10/2019 Discharge date: 08/16/2019  Time spent: Less than 30 minutes  Recommendations for Outpatient Follow-up:  1. PCP  Discharge Diagnoses:  Active Hospital Problems   Diagnosis Date Noted   Abscess of neck 08/10/2019   Acute mastoiditis of right side with abscess of neck 08/10/2019   Neck abscess 08/10/2019    Resolved Hospital Problems  No resolved problems to display.    Discharge Condition: Improved  Diet recommendation: Regular  Vitals:   08/15/19 2111 08/16/19 0526  BP: 102/75 106/66  Pulse: 97 65  Resp: 18 15  Temp: 98.9 F (37.2 C) 98.6 F (37 C)  SpO2: 98% 100%    History of present illness:  28 year old Hispanic male admitted with right neck swelling and jaw pain leading to difficulty opening his mouth patient noted to have had right parapharyngeal abscess on CT imaging and is status post I&D by ENT on August 10, 2019.  Repeat CT scan from August 13, 2019 does show some improvement however he still continues to have pain and difficulty opening his mouth.  He is currently on antibiotics he is making progress and he is going to have one to another day of IV antibiotics today which will be switched to p.o. and discharge home tomorrow likely Patient seen and examined at bedside.  He denies fever he still has some pain on the right neck area at the incision site.   Hospital Course:  Principal Problem:   Abscess of neck Active Problems:   Acute mastoiditis of right side with abscess of neck   Neck abscess   Procedures:  I&D  Consultations:  ENT  Discharge Exam: BP 106/66 (BP Location: Left Arm)    Pulse 65    Temp 98.6 F (37 C) (Oral)    Resp 15    Ht 5\' 11"  (1.803 m)    Wt 62 kg    SpO2 100%    BMI 19.06 kg/m     General: Calm normal body habitus not in pain  HEENT: Right neck has an incision point with no sutures there is no  draining there is no tenderness and no redness  Cardiovascular: Regular rate and rhythm no murmur  Respiratory: Unlabored even respiration clear to auscultation bilaterally  Abdomen: Soft nontender  Musculoskeletal: Moves all extremities  Skin: Tattoo no rash Psychiatry: Pleasant alert and oriented x3   Discharge Instructions You were cared for by a hospitalist during your hospital stay. If you have any questions about your discharge medications or the care you received while you were in the hospital after you are discharged, you can call the unit and asked to speak with the hospitalist on call if the hospitalist that took care of you is not available. Once you are discharged, your primary care physician will handle any further medical issues. Please note that NO REFILLS for any discharge medications will be authorized once you are discharged, as it is imperative that you return to your primary care physician (or establish a relationship with a primary care physician if you do not have one) for your aftercare needs so that they can reassess your need for medications and monitor your lab values.  Discharge Instructions    Call MD for:  redness, tenderness, or signs of infection (pain, swelling, redness, odor or green/yellow discharge around incision site)   Complete by: As directed    Call MD for:  severe uncontrolled  pain   Complete by: As directed    Call MD for:  temperature >100.4   Complete by: As directed    Diet - low sodium heart healthy   Complete by: As directed    Discharge instructions   Complete by: As directed    MUST COMPLET  7 DAYS OF ORAL ANTIBIOTICS   Increase activity slowly   Complete by: As directed      Allergies as of 08/16/2019   No Known Allergies     Medication List    STOP taking these medications   amoxicillin 500 MG capsule Commonly known as: AMOXIL   methylPREDNISolone 4 MG Tbpk tablet Commonly known as: MEDROL DOSEPAK     TAKE these  medications   amoxicillin-clavulanate 875-125 MG tablet Commonly known as: Augmentin Take 1 tablet by mouth every 12 (twelve) hours for 7 days.   bacitracin ointment Apply 1 application topically every 8 (eight) hours.   HYDROcodone-acetaminophen 5-325 MG tablet Commonly known as: NORCO/VICODIN Take 1 tablet by mouth every 4 (four) hours as needed for pain.      No Known Allergies    The results of significant diagnostics from this hospitalization (including imaging, microbiology, ancillary and laboratory) are listed below for reference.    Significant Diagnostic Studies: Ct Soft Tissue Neck W Contrast  Result Date: 08/13/2019 CLINICAL DATA:  28 year old male status post surgical drainage of large trans spatial neck abscess diagnosed by CT on 08/10/2019. EXAM: CT NECK WITH CONTRAST TECHNIQUE: Multidetector CT imaging of the neck was performed using the standard protocol following the bolus administration of intravenous contrast. CONTRAST:  75 milliliters Omnipaque 300. COMPARISON:  Pretreatment CT 08/10/2019. FINDINGS: Pharynx and larynx: Substantially regressed abnormal right pharyngeal and parapharyngeal space soft tissue swelling and inflammation. No significant mass effect on the airway now. The left parapharyngeal space remains normal. The larynx is normal. The retropharyngeal spaces normalized. Surgical drains have recently been removed from the large trans spatial right pharyngeal and parapharyngeal neck abscess. There is a residual gas containing right palatine tonsil cavity on series 3, image 30. There remains a trace amount of fluid and gas tracking from this cavity laterally anterior to the styloid process into the right parotid gland. But parapharyngeal and parotid space gas and fluid have also substantially decreased. Furthermore, the lateral extension of the abscess was also drained to the skin as seen on series 3, image 44, which still appears to be in communication with some of  the residual gas and fluid. There is residual gas and fluid both within the right parotid space and also the superior right masticator space adjacent to the right mandible on series 3, image 21. The right parotid gland is asymmetrically enlarged and hyperenhancing. The right masticator space and submandibular spaces also appear improved. Salivary glands: Right parotid discussed above. The submandibular glands now have a relatively symmetric appearance. Negative sublingual space. The left parotid gland remains normal. Thyroid: Negative. Lymph nodes: Decreased lymph node enhancement. No new or progressive lymphadenopathy. Vascular: The major vascular structures in the neck and at the skull base remain patent, including the right IJ. Limited intracranial: Negative. Visualized orbits: Negative. Mastoids and visualized paranasal sinuses: Well pneumatized aside from mucosal thickening in the left frontal sinus. Skeleton: Carious dentition. Right mandible remains intact. No new osseous abnormality identified. Upper chest: Visible mediastinum remains normal. Visible upper lungs are stable, left upper lobe scarring suspected on series 3, image 92. IMPRESSION: 1. Substantially improved large multi-spatial odontogenic right neck abscess following surgical  drainage both from the pharynx and from the skin surface. Small volume residual gas and fluid within the affected areas, perhaps most notably now in the right parotid space and adjacent to the right mandible ramus, with associated acute right parotiditis. 2. Study was reviewed in person with Dr. Narda Bonds on 08/13/2019 at 1734 hours. The patient will be continued on IV antibiotics. He and I discussed repeat Neck CT with IV contrast if the patient's condition worsens, or after several more days if his right parotid region pain persists. Electronically Signed   By: Odessa Fleming M.D.   On: 08/13/2019 18:23   Ct Soft Tissue Neck W Contrast  Result Date: 08/10/2019 CLINICAL DATA:   28 year old male with recent wisdom tooth extraction and right lower jaw swelling. EXAM: CT NECK WITH CONTRAST TECHNIQUE: Multidetector CT imaging of the neck was performed using the standard protocol following the bolus administration of intravenous contrast. CONTRAST:  26mL OMNIPAQUE IOHEXOL 300 MG/ML  SOLN COMPARISON:  None. FINDINGS: Pharynx and larynx: Abnormal pharynx with a large gas and fluid containing multi spatial abscess involving the right tonsil (series 3, image 23). The tonsillar component of the infected collection is roughly 36 x 39 x 45 millimeters (AP by transverse by CC), estimated at 32 milliliters. The abnormality tracks through the right parapharyngeal space into the right masticator and submandibular spaces. Severe mass effect on the pharyngeal airway which is shifted to the left. The left parapharyngeal space remains normal. There is a retropharyngeal effusion, but no gas in the retropharyngeal space to strongly suggest communication with the large abscess. Gas distended hypopharynx with motion artifact there and at the larynx which seem to remain normal. Salivary glands: Grossly negative sublingual space. Submandibular glands are symmetrically enhancing. The trans spatial right side abscess extends into the right parotid space from the adjacent right masticator space, containing air in fluid. The left parotid remains normal. Thyroid: Negative. Lymph nodes: Reactive appearing right level 2A lymph node measures about 16 millimeter short axis. Vascular: Motion artifact, but the major vascular structures in the neck and at the skull base seem to remain patent-although the superior right IJ is completely effaced and the transposed Scholl abscess directly abuts the right carotid space on series 3, image 16. Limited intracranial: Negative. Visualized orbits: Partially visible, negative. Mastoids and visualized paranasal sinuses: Only trace paranasal sinus mucosal thickening. Visible right  tympanic cavity and mastoid are clear. Skeleton: Extracted right mandible wisdom tooth but with cortical breakthrough through the medial inferior mandible as seen on series 5, image 37 and sagittal image 40. Carious nearby right maxillary wisdom tooth. Carious contralateral left side wisdom teeth. The remainder of the right mandible is intact. The right TMJ is normally located. No other acute osseous abnormality identified. Upper chest: Negative trachea and other visible major airways. The visible upper lungs are clear aside from what appears to be an area of mild left upper lobe scarring on series 5, image 90. Normal superior mediastinum. IMPRESSION: 1. Severe multi-spatial Right Neck Abscess containing air and fluid and with a bulky 32 mL right tonsillar component. Involvement of the right: Parapharyngeal, CAROTID, masticator, submandibular, and parotid spaces. Effaced but not thrombosed Right IJ. Retropharyngeal space relatively spared at this time; small retropharyngeal effusion. Sublingual space appears spared. 2. Narrowing of the oropharyngeal airway. Negative visible mediastinum and upper chest. Study discussed by telephone with PA-C CORTNI COUTURE on 08/10/2019 at 16:14 . Electronically Signed   By: Odessa Fleming M.D.   On: 08/10/2019 16:17  Microbiology: Recent Results (from the past 240 hour(s))  Blood culture (routine x 2)     Status: None   Collection Time: 08/10/19  1:20 PM   Specimen: BLOOD LEFT FOREARM  Result Value Ref Range Status   Specimen Description BLOOD LEFT FOREARM  Final   Special Requests   Final    BOTTLES DRAWN AEROBIC AND ANAEROBIC Blood Culture results may not be optimal due to an inadequate volume of blood received in culture bottles   Culture   Final    NO GROWTH 5 DAYS Performed at [facility], [place] [address], [place], [place] [address]    Report Status 08/15/2019 FINAL  Final  Blood culture (routine x 2)     Status: None   Collection Time: 08/10/19  1:30 PM    Specimen: BLOOD LEFT WRIST  Result Value Ref Range Status   Specimen Description BLOOD LEFT WRIST  Final   Special Requests   Final    BOTTLES DRAWN AEROBIC AND ANAEROBIC Blood Culture adequate volume   Culture   Final    NO GROWTH 5 DAYS Performed at [facility], [place] [address], [place], [place] [address]    Report Status 08/15/2019 FINAL  Final  SARS Coronavirus 2 by RT PCR (hospital order, performed in [facility] hospital lab) Nasopharyngeal Nasopharyngeal Swab     Status: None   Collection Time: 08/10/19  6:15 PM   Specimen: Nasopharyngeal Swab  Result Value Ref Range Status   SARS Coronavirus 2 NEGATIVE NEGATIVE Final    Comment: (NOTE) If result is NEGATIVE SARS-CoV-2 target nucleic acids are NOT DETECTED. The SARS-CoV-2 RNA is generally detectable in upper and lower  respiratory specimens during the acute phase of infection. The lowest  concentration of SARS-CoV-2 viral copies this assay can detect is 250  copies / mL. A negative result does not preclude SARS-CoV-2 infection  and should not be used as the sole basis for treatment or other  patient management decisions.  A negative result may occur with  improper specimen collection / handling, submission of specimen other  than nasopharyngeal swab, presence of viral mutation(s) within the  areas targeted by this assay, and inadequate number of viral copies  (<250 copies / mL). A negative result must be combined with clinical  observations, patient history, and epidemiological information. If result is POSITIVE SARS-CoV-2 target nucleic acids are DETECTED. The SARS-CoV-2 RNA is generally detectable in upper and lower  respiratory specimens dur ing the acute phase of infection.  Positive  results are indicative of active infection with SARS-CoV-2.  Clinical  correlation with patient history and other diagnostic information is  necessary to determine patient infection status.  Positive results do  not rule out  bacterial infection or co-infection with other viruses. If result is PRESUMPTIVE POSTIVE SARS-CoV-2 nucleic acids MAY BE PRESENT.   A presumptive positive result was obtained on the submitted specimen  and confirmed on repeat testing.  While 2019 novel coronavirus  (SARS-CoV-2) nucleic acids may be present in the submitted sample  additional confirmatory testing may be necessary for epidemiological  and / or clinical management purposes  to differentiate between  SARS-CoV-2 and other Sarbecovirus currently known to infect humans.  If clinically indicated additional testing with an alternate test  methodology [phone]) is advised. The SARS-CoV-2 RNA is generally  detectable in upper and lower respiratory sp ecimens during the acute  phase of infection. The expected result is Negative. Fact Sheet for Patients:  StrictlyIdeas.no  Fact Sheet for Healthcare Providers: https://pope.com/ This test is not yet approved or cleared by the [place] FDA and has been authorized for detection and/or diagnosis of SARS-CoV-2 by FDA under an Emergency Use Authorization (EUA).  This EUA will remain in effect (meaning this test can be used) for the duration of the COVID-19 declaration under Section 564(b)(1) of the Act, 21 U.S.C. section 360bbb-3(b)(1), unless the authorization is terminated or revoked sooner. Performed at [facility], [address], [place], [place] [address]   Urine culture     Status: None   Collection Time: 08/10/19  6:45 PM   Specimen: In/Out Cath Urine  Result Value Ref Range Status   Specimen Description IN/OUT CATH URINE  Final   Special Requests NONE  Final   Culture   Final    NO GROWTH Performed at [facility], [address] [place], [place] [address]    Report Status 08/11/2019 FINAL  Final  Aerobic/Anaerobic Culture (surgical/deep wound)     Status: None   Collection Time: 08/10/19  9:42 PM    Specimen: Abscess  Result Value Ref Range Status   Specimen Description ABSCESS RIGHT NECK  Final   Special Requests NONE  Final   Gram Stain   Final    RARE WBC PRESENT,BOTH PMN AND MONONUCLEAR MODERATE GRAM NEGATIVE RODS FEW GRAM POSITIVE COCCI IN PAIRS MODERATE GRAM POSITIVE RODS    Culture   Final    FEW EIKENELLA CORRODENS Usually susceptible to penicillin and other beta lactam agents,quinolones,macrolides and tetracyclines. NO ANAEROBES ISOLATED Performed at [facility], [address], [place], [place] [address]    Report Status 08/15/2019 FINAL  Final     Labs: Basic Metabolic Panel: Recent Labs  Lab 08/10/19 1437 08/11/19 0248 08/12/19 0216 08/13/19 0127  NA 134* 136 137 135  K 3.8 4.1 3.9 3.8  CL 95* 101 106 103  CO2  --  GLUCOSE 109* 105* 99 103*  BUN CREATININE 0.80 0.85 0.76 0.72  CALCIUM  --  8.4* 8.1* 8.5*   Liver Function Tests: No results for input(s): AST, ALT, ALKPHOS, BILITOT, PROT, ALBUMIN in the last 168 hours. No results for input(s): LIPASE, AMYLASE in the last 168 hours. No results for input(s): AMMONIA in the last 168 hours. CBC: Recent Labs  Lab 08/10/19 1313 08/10/19 1437 08/11/19 0248 08/12/19 0216 08/13/19 0127 08/15/19 2055  WBC 25.6*  --  27.7* 18.8* 7.5 7.3  NEUTROABS 22.9*  --   --   --   --  3.4  HGB 14.6 15.6 12.5* 12.3* 12.8* 13.7  HCT 43.6 46.0 37.4* 36.6* 38.6* 41.0  MCV 92.4  --  92.8 92.2 92.1 91.9  PLT 458*  --  427* 405* 437* 610*   Cardiac Enzymes: No results for input(s): CKTOTAL, CKMB, CKMBINDEX, TROPONINI in the last 168 hours. BNP: BNP (last 3 results) No results for input(s): BNP in the last 8760 hours.  ProBNP (last 3 results) No results for input(s): PROBNP in the last 8760 hours.  CBG: No results for input(s): GLUCAP in the last 168 hours.     Signed:  Myrtie Neither, MD [facility] 08/16/2019, 10:17 AM

## 2019-08-16 NOTE — Progress Notes (Signed)
Ithan Heaps to be D/C'd  per MD order. Discussed with the patient and all questions fully answered.  VSS, Skin clean, dry and intact without evidence of skin break down, no evidence of skin tears noted.  IV catheter discontinued intact. Site without signs and symptoms of complications. Dressing and pressure applied.  An After Visit Summary was printed and given to the patient. Patient received prescription.  D/c education completed with patient/family including follow up instructions, medication list, d/c activities limitations if indicated, with other d/c instructions as indicated by MD - patient able to verbalize understanding, all questions fully answered.   Patient instructed to return to ED, call 911, or call MD for any changes in condition.   Patient to be escorted via [place], and D/C home via private auto.

## 2020-04-11 ENCOUNTER — Encounter (HOSPITAL_BASED_OUTPATIENT_CLINIC_OR_DEPARTMENT_OTHER): Payer: Self-pay

## 2020-04-11 ENCOUNTER — Emergency Department (HOSPITAL_BASED_OUTPATIENT_CLINIC_OR_DEPARTMENT_OTHER): Payer: Self-pay

## 2020-04-11 ENCOUNTER — Other Ambulatory Visit: Payer: Self-pay

## 2020-04-11 ENCOUNTER — Emergency Department (HOSPITAL_BASED_OUTPATIENT_CLINIC_OR_DEPARTMENT_OTHER)
Admission: EM | Admit: 2020-04-11 | Discharge: 2020-04-11 | Disposition: A | Discharge status: Home / Self Care | Payer: Self-pay | Attending: Emergency Medicine | Admitting: Emergency Medicine

## 2020-04-11 DIAGNOSIS — Y929 Unspecified place or not applicable: Secondary | ICD-10-CM | POA: Insufficient documentation

## 2020-04-11 DIAGNOSIS — Y999 Unspecified external cause status: Secondary | ICD-10-CM | POA: Insufficient documentation

## 2020-04-11 DIAGNOSIS — X501XXA Overexertion from prolonged static or awkward postures, initial encounter: Secondary | ICD-10-CM | POA: Insufficient documentation

## 2020-04-11 DIAGNOSIS — Y9302 Activity, running: Secondary | ICD-10-CM | POA: Insufficient documentation

## 2020-04-11 DIAGNOSIS — S43005A Unspecified dislocation of left shoulder joint, initial encounter: Secondary | ICD-10-CM | POA: Insufficient documentation

## 2020-04-11 DIAGNOSIS — Z87891 Personal history of nicotine dependence: Secondary | ICD-10-CM | POA: Insufficient documentation

## 2020-04-11 MED ORDER — METHOCARBAMOL 500 MG PO TABS: 500.0000 mg | ORAL_TABLET | Freq: Three times a day (TID) | ORAL | 0 refills | Status: AC | PRN

## 2020-04-11 NOTE — ED Notes (Signed)
ED Provider at bedside. 

## 2020-04-11 NOTE — Discharge Instructions (Signed)
Follow-up with the sports medicine doctor for further treatment of your likely shoulder dislocation.

## 2020-04-11 NOTE — ED Notes (Signed)
Patient transported to X-ray 
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
## 2020-09-11 ENCOUNTER — Other Ambulatory Visit: Payer: Self-pay

## 2020-09-11 ENCOUNTER — Emergency Department (HOSPITAL_COMMUNITY)
Admission: EM | Admit: 2020-09-11 | Discharge: 2020-09-11 | Disposition: A | Discharge status: Home / Self Care | Payer: Self-pay | Attending: Emergency Medicine | Admitting: Emergency Medicine

## 2020-09-11 DIAGNOSIS — R109 Unspecified abdominal pain: Secondary | ICD-10-CM | POA: Insufficient documentation

## 2020-09-11 DIAGNOSIS — R112 Nausea with vomiting, unspecified: Secondary | ICD-10-CM | POA: Insufficient documentation

## 2020-09-11 DIAGNOSIS — Z87891 Personal history of nicotine dependence: Secondary | ICD-10-CM | POA: Insufficient documentation

## 2020-09-11 MED ORDER — ONDANSETRON 4 MG PO TBDP: 4.0000 mg | ORAL_TABLET | Freq: Once | ORAL | Status: DC

## 2020-09-11 MED ORDER — DICYCLOMINE HCL 10 MG PO CAPS
10.0000 mg | ORAL_CAPSULE | Freq: Once | ORAL | Status: AC
  Administered 2020-09-11: 10 mg via ORAL
  Filled 2020-09-11: qty 1

## 2020-09-11 MED ORDER — DICYCLOMINE HCL 20 MG PO TABS: 20.0000 mg | ORAL_TABLET | Freq: Two times a day (BID) | ORAL | 0 refills | Status: AC

## 2020-09-11 MED ORDER — ONDANSETRON 4 MG PO TBDP
4.0000 mg | ORAL_TABLET | Freq: Once | ORAL | Status: AC
  Administered 2020-09-11: 4 mg via ORAL
  Filled 2020-09-11: qty 1

## 2020-09-11 MED ORDER — ONDANSETRON 4 MG PO TBDP: 4.0000 mg | ORAL_TABLET | Freq: Three times a day (TID) | ORAL | 0 refills | Status: DC | PRN
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
## 2020-09-11 NOTE — ED Provider Notes (Signed)
[facility]-EMERGENCY DEPT Provider Note   CSN: [id number] Arrival date & time: 09/11/20  1147     History Chief Complaint  Patient presents with  . Emesis    Darryl Lloyd is a 29 y.o. male.  HPI 29 year old male with a history of mastoiditis, hemorrhoids presents to the ER with complaints of nausea and vomiting.  He states he had "3 bottles of Patron" amongst friends.  Has had some abdominal cramping.  No fevers or chills.  No back pain.  No dysuria, hematuria.  Has been having normal bowel movements.    Past Medical History:  Diagnosis Date  . Difficult intubation   . Hemorrhoids     Patient Active Problem List   Diagnosis Date Noted  . Abscess of neck 08/10/2019  . Acute mastoiditis of right side with abscess of neck 08/10/2019  . Neck abscess 08/10/2019    Past Surgical History:  Procedure Laterality Date  . INCISION AND DRAINAGE ABSCESS Right 08/10/2019   Procedure: INCISION AND DRAINAGE RIGHT NECK  ABSCESS, PHARYNGEAL ABSCESS;  Surgeon: Drema Halon, MD;  Location: [facility] OR;  Service: ENT;  Laterality: Right;       Family History  Family history unknown: Yes    Social History   Tobacco Use  . Smoking status: Former Smoker    Packs/day: 1.00    Types: Cigarettes  . Smokeless tobacco: Never Used  Vaping Use  . Vaping Use: Never used  Substance Use Topics  . Alcohol use: Yes  . Drug use: Not Currently    Home Medications Prior to Admission medications   Medication Sig Start Date End Date Taking? Authorizing Provider  bacitracin ointment Apply 1 application topically every 8 (eight) hours. 08/16/19   Myrtie Neither, MD  dicyclomine (BENTYL) 20 MG tablet Take 1 tablet (20 mg total) by mouth 2 (two) times daily. 09/11/20   Mare Ferrari, PA-C  HYDROcodone-acetaminophen (NORCO/VICODIN) 5-325 MG tablet Take 1 tablet by mouth every 4 (four) hours as needed for pain. 08/03/19   [provider]  methocarbamol (ROBAXIN)  500 MG tablet Take 1 tablet (500 mg total) by mouth every 8 (eight) hours as needed for muscle spasms. 04/11/20   Benjiman Core, MD  ondansetron (ZOFRAN ODT) 4 MG disintegrating tablet Take 1 tablet (4 mg total) by mouth every 8 (eight) hours as needed for nausea or vomiting. 09/11/20   Mare Ferrari, PA-C    Allergies    Patient has no known allergies.  Review of Systems   Review of Systems  Constitutional: Negative for fever.  Respiratory: Negative for shortness of breath.   Cardiovascular: Negative for chest pain.  Gastrointestinal: Positive for abdominal pain, nausea and vomiting. Negative for blood in stool, constipation, diarrhea and rectal pain.    Physical Exam Updated Vital Signs BP 128/80 (BP Location: Right Arm)   Pulse 93   Temp 98.5 F (36.9 C) (Oral)   Resp 16   SpO2 97%   Physical Exam Vitals and nursing note reviewed.  Constitutional:      General: He is not in acute distress.    Appearance: Normal appearance. He is well-developed. He is not ill-appearing or diaphoretic.  HENT:     Head: Normocephalic and atraumatic.  Eyes:     Conjunctiva/sclera: Conjunctivae normal.  Cardiovascular:     Rate and Rhythm: Normal rate and regular rhythm.     Pulses: Normal pulses.     Heart sounds: Normal heart sounds. No murmur heard.  Pulmonary:     Effort: Pulmonary effort is normal. No respiratory distress.     Breath sounds: Normal breath sounds.  Abdominal:     Palpations: Abdomen is soft.     Tenderness: There is no abdominal tenderness.     Comments: Abdomen is soft and nontender, with no focal tenderness, no flank pain, no peritoneal signs  Musculoskeletal:        General: No tenderness or deformity. Normal range of motion.     Cervical back: Neck supple.  Skin:    General: Skin is warm and dry.     Findings: No erythema.  Neurological:     General: No focal deficit present.     Mental Status: He is alert and oriented to person, place, and time.      ED Results / Procedures / Treatments   Labs (all labs ordered are listed, but only abnormal results are displayed) Labs Reviewed - No data to display  EKG None  Radiology No results found.  Procedures Procedures (including critical care time)  Medications Ordered in ED Medications  ondansetron (ZOFRAN-ODT) disintegrating tablet 4 mg (4 mg Oral Given 09/11/20 1251)  dicyclomine (BENTYL) capsule 10 mg (10 mg Oral Given 09/11/20 1251)    ED Course  I have reviewed the triage vital signs and the nursing notes.  Pertinent labs & imaging results that were available during my care of the patient were reviewed by me and considered in my medical decision making (see chart for details).    MDM Rules/Calculators/A&P                          29 year old male with nausea, vomiting, abdominal cramping.  Overall vitals reassuring, he is afebrile.  Physical exam with soft nontender abdomen, no flank tenderness.  Suspect side effect of alcohol versus acute abdomen including appendicitis, pancreatitis, cholecystitis.  Will give Bentyl, Zofran and fluid challenge.  Will reassess.  Patient tolerated p.o. without difficulty.  On reassessment, patient states abdominal pain is entirely gone.  Overall well-appearing.  Stable for discharge with return precautions.  Will send home with Bentyl and Zofran.  He voiced understanding and is agreeable.   Final Clinical Impression(s) / ED Diagnoses Final diagnoses:  Non-intractable vomiting with nausea, unspecified vomiting type    Rx / DC Orders ED Discharge Orders         Ordered    ondansetron (ZOFRAN ODT) 4 MG disintegrating tablet  Every 8 hours PRN        09/11/20 1344    dicyclomine (BENTYL) 20 MG tablet  2 times daily        09/11/20 1344           Leone Brand 09/11/20 1344    Lorre Nick, MD 09/12/20 1343

## 2020-09-11 NOTE — ED Notes (Signed)
Patient given ginger ale. 

## 2020-09-11 NOTE — Discharge Instructions (Signed)
Please take the medicine as needed.  Return to the ER for any new or worsening symptoms.

## 2020-10-28 IMAGING — CR RIGHT FOOT COMPLETE - 3+ VIEW
3 series · 3 of 3 positions shown · non-contrast
Comparison: None.

CLINICAL DATA: Injury, medial malleolus

EXAM:
RIGHT ANKLE - COMPLETE 3+ VIEW; RIGHT FOOT COMPLETE - 3+ VIEW

[x foot ap right]
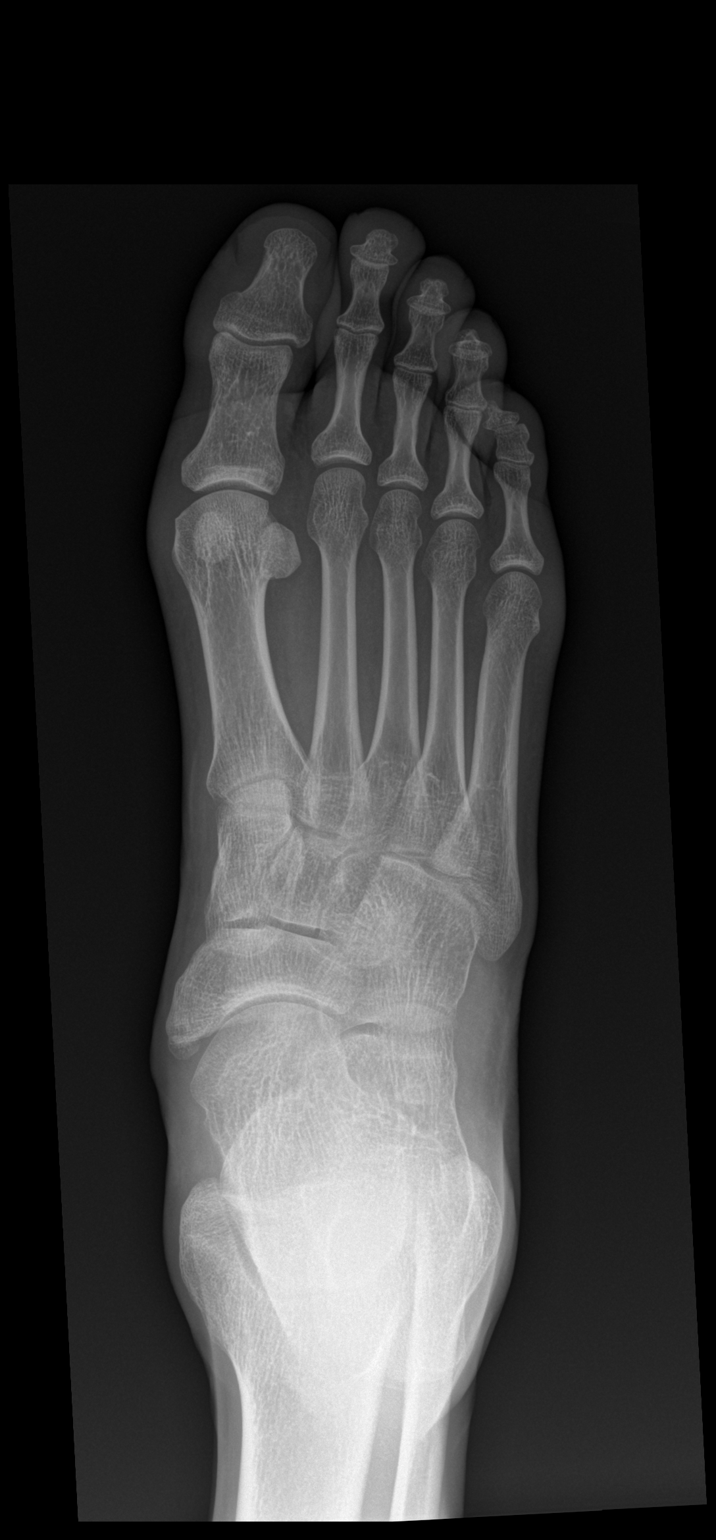

[x foot obl right]
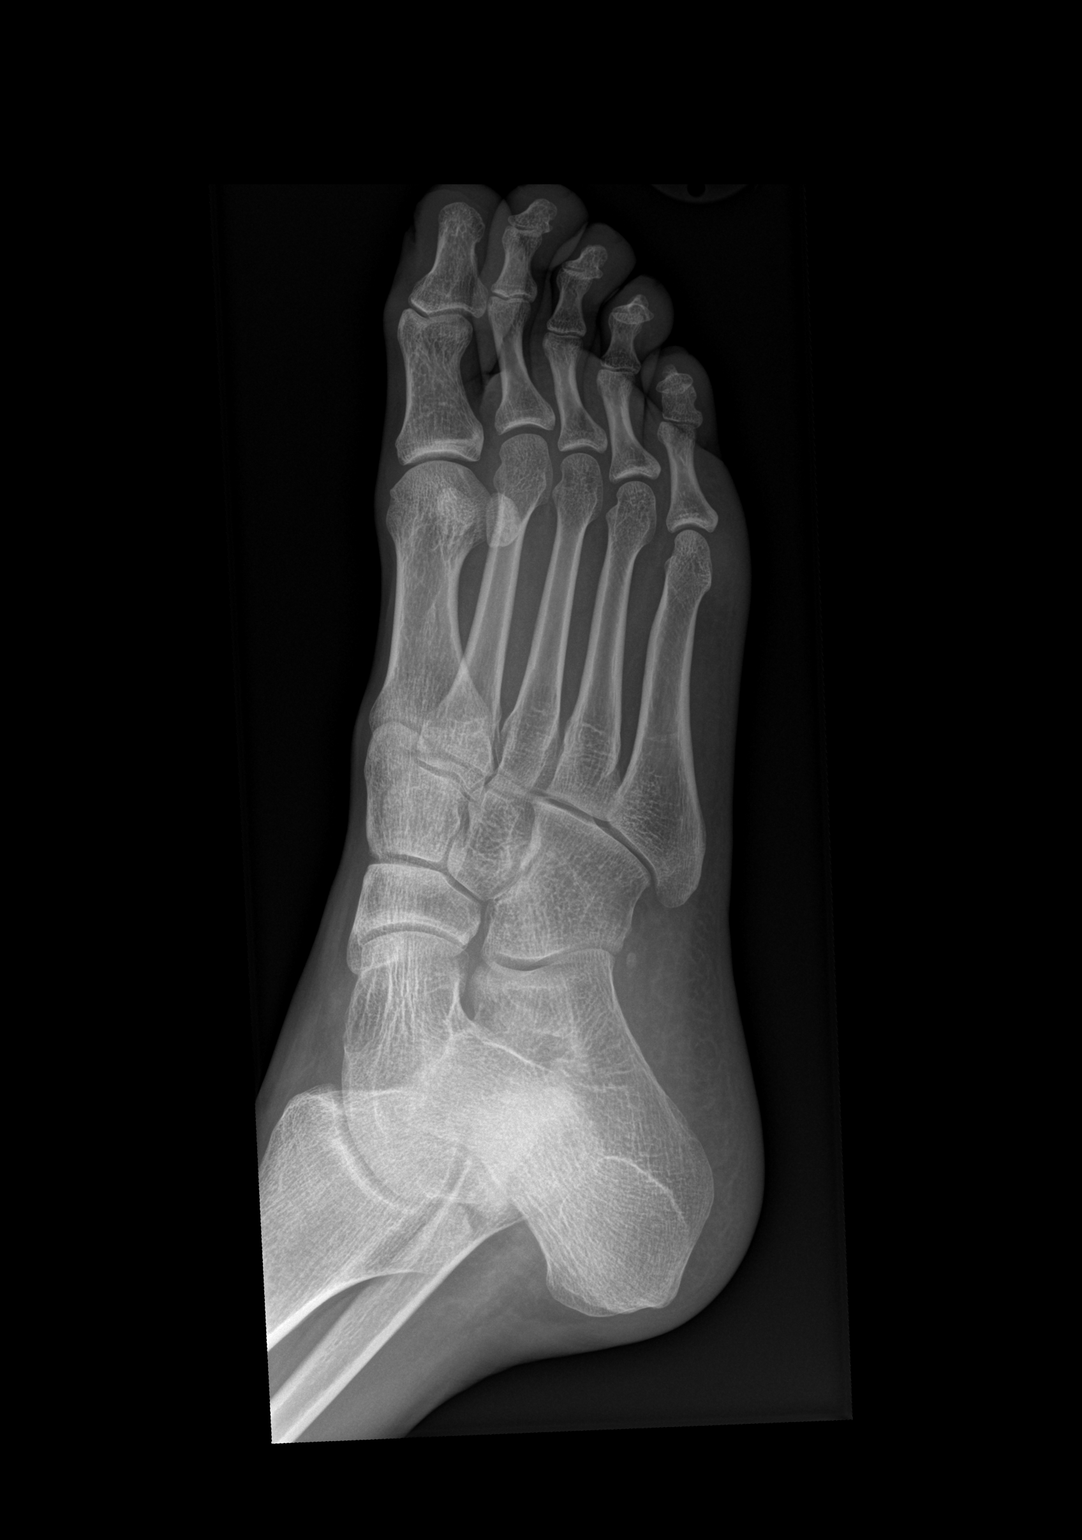

[x foot lat right]
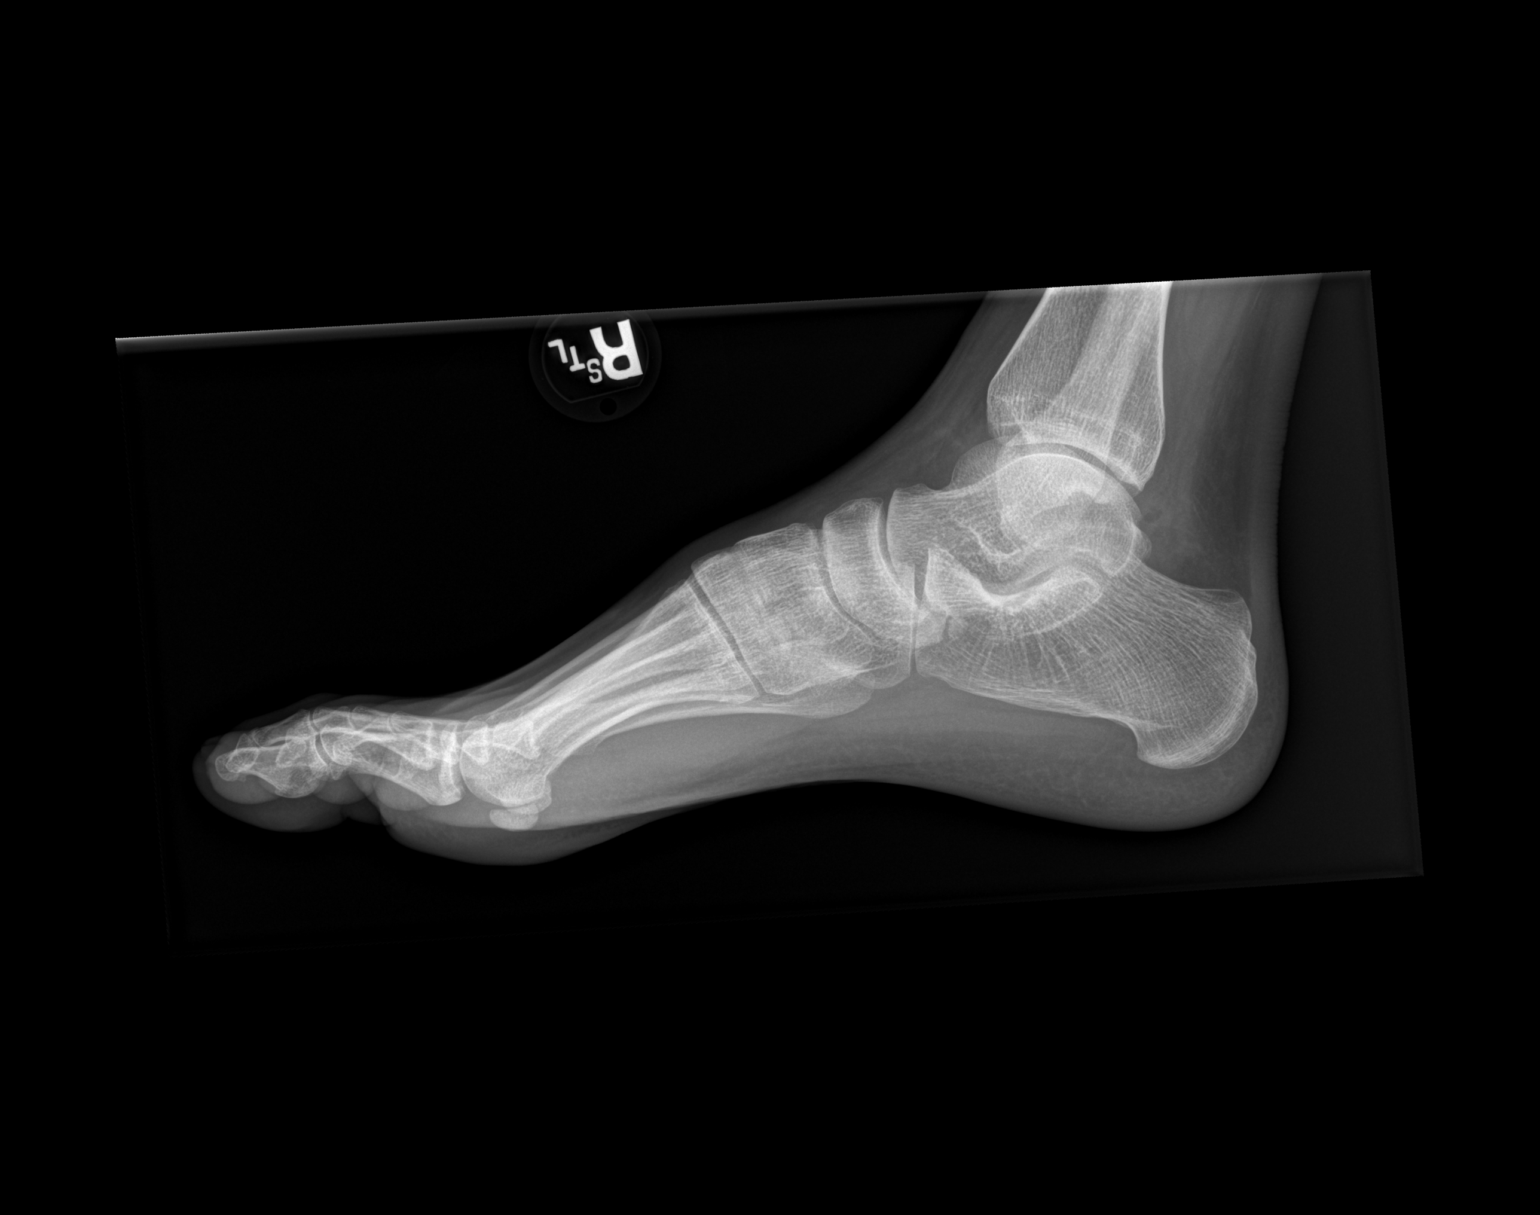

[3 of 3 positions shown; findings below may reference images not displayed]

FINDINGS: No fracture or dislocation of the right foot or ankle with
particular attention to the medial malleolus. Incidental note of a
small accessory os naviculare. Joint spaces are well preserved. Soft
tissues are unremarkable.
IMPRESSION: No fracture or dislocation of the right foot or ankle with
particular attention to the medial malleolus. Incidental note of a
small accessory os naviculare. Joint spaces are well preserved. Soft
tissues are unremarkable.

## 2021-09-15 ENCOUNTER — Encounter (HOSPITAL_COMMUNITY): Payer: Self-pay

## 2021-09-15 ENCOUNTER — Emergency Department (HOSPITAL_COMMUNITY)
Admission: EM | Admit: 2021-09-15 | Discharge: 2021-09-15 | Disposition: A | Discharge status: Home / Self Care | Payer: Self-pay | Attending: Emergency Medicine | Admitting: Emergency Medicine

## 2021-09-15 ENCOUNTER — Other Ambulatory Visit: Payer: Self-pay

## 2021-09-15 DIAGNOSIS — R112 Nausea with vomiting, unspecified: Secondary | ICD-10-CM | POA: Insufficient documentation

## 2021-09-15 DIAGNOSIS — Z87891 Personal history of nicotine dependence: Secondary | ICD-10-CM | POA: Insufficient documentation

## 2021-09-15 LAB — COMPREHENSIVE METABOLIC PANEL
ALT: 19 U/L (ref 0–44)
AST: 23 U/L (ref 15–41)
Albumin: 4.5 g/dL (ref 3.5–5.0)
Alkaline Phosphatase: 73 U/L (ref 38–126)
Anion gap: 7 (ref 5–15)
BUN: 16 mg/dL (ref 6–20)
CO2: 25 mmol/L (ref 22–32)
Calcium: 8.8 mg/dL — ABNORMAL LOW (ref 8.9–10.3)
Chloride: 105 mmol/L (ref 98–111)
Creatinine, Ser: 0.91 mg/dL (ref 0.61–1.24)
GFR, Estimated: >60 mL/min (ref >60)
Glucose, Bld: 83 mg/dL (ref 70–99)
Potassium: 3.8 mmol/L (ref 3.5–5.1)
Sodium: 137 mmol/L (ref 135–145)
Total Bilirubin: 1.1 mg/dL (ref 0.3–1.2)
Total Protein: 7.5 g/dL (ref 6.5–8.1)

## 2021-09-15 LAB — URINALYSIS, ROUTINE W REFLEX MICROSCOPIC
Bilirubin Urine: NEGATIVE
Glucose, UA: NEGATIVE mg/dL
Hgb urine dipstick: NEGATIVE
Ketones, ur: NEGATIVE mg/dL
Leukocytes,Ua: NEGATIVE
Nitrite: NEGATIVE
Protein, ur: NEGATIVE mg/dL
Specific Gravity, Urine: >1.03 — ABNORMAL HIGH (ref 1.005–1.030)
pH: 6 (ref 5.0–8.0)

## 2021-09-15 LAB — LIPASE, BLOOD: Lipase: 24 U/L (ref 11–51)

## 2021-09-15 LAB — CBC
HCT: 41.5 % (ref 39.0–52.0)
Hemoglobin: 14 g/dL (ref 13.0–17.0)
MCH: 30.4 pg (ref 26.0–34.0)
MCHC: 33.7 g/dL (ref 30.0–36.0)
MCV: 90.2 fL (ref 80.0–100.0)
Platelets: 314 10*3/uL (ref 150–400)
RBC: 4.6 MIL/uL (ref 4.22–5.81)
RDW: 12.4 % (ref 11.5–15.5)
WBC: 4.8 10*3/uL (ref 4.0–10.5)
nRBC: 0 % (ref 0.0–0.2)

## 2021-09-15 MED ORDER — ONDANSETRON 8 MG PO TBDP: 8.0000 mg | ORAL_TABLET | Freq: Three times a day (TID) | ORAL | 0 refills | Status: DC | PRN

## 2021-09-15 NOTE — ED Triage Notes (Addendum)
Patient reports that he has been vomiting and upper abdominal pain since this AM. Patient denies any fever, or diarrhea.

## 2021-09-15 NOTE — ED Provider Notes (Signed)
[facility]-EMERGENCY DEPT Provider Note   CSN: [id number] Arrival date & time: 09/15/21  1235     History Chief Complaint  Patient presents with   Emesis    Faustino Luecke is a 30 y.o. male.   Emesis  Patient presented to the ED for evaluation of nausea and vomiting.  Patient states he had a few episodes this morning.  He has not had any further episodes since throughout the day.  Patient is not having any abdominal pain.  He is not having any diarrhea.  Patient unfortunately had to wait a few hours after his initial screening evaluation.  Patient states he is fine now and is ready to go home.  Patient is requesting a note for work as he was supposed to work today  Past Medical History:  Diagnosis Date   Difficult intubation    Hemorrhoids     Patient Active Problem List   Diagnosis Date Noted   Abscess of neck 08/10/2019   Acute mastoiditis of right side with abscess of neck 08/10/2019   Neck abscess 08/10/2019    Past Surgical History:  Procedure Laterality Date   INCISION AND DRAINAGE ABSCESS Right 08/10/2019   Procedure: INCISION AND DRAINAGE RIGHT NECK  ABSCESS, PHARYNGEAL ABSCESS;  Surgeon: Drema Halon, MD;  Location: [facility] OR;  Service: ENT;  Laterality: Right;       Family History  Family history unknown: Yes    Social History   Tobacco Use   Smoking status: Former    Packs/day: 1.00    Types: Cigarettes   Smokeless tobacco: Never  Vaping Use   Vaping Use: Never used  Substance Use Topics   Alcohol use: Yes   Drug use: Not Currently    Home Medications Prior to Admission medications   Medication Sig Start Date End Date Taking? Authorizing Provider  ondansetron (ZOFRAN-ODT) 8 MG disintegrating tablet Take 1 tablet (8 mg total) by mouth every 8 (eight) hours as needed for nausea or vomiting. 09/15/21  Yes Linwood Dibbles, MD  bacitracin ointment Apply 1 application topically every 8 (eight) hours. 08/16/19   Myrtie Neither, MD   dicyclomine (BENTYL) 20 MG tablet Take 1 tablet (20 mg total) by mouth 2 (two) times daily. 09/11/20   Mare Ferrari, PA-C  HYDROcodone-acetaminophen (NORCO/VICODIN) 5-325 MG tablet Take 1 tablet by mouth every 4 (four) hours as needed for pain. 08/03/19   [provider]  methocarbamol (ROBAXIN) 500 MG tablet Take 1 tablet (500 mg total) by mouth every 8 (eight) hours as needed for muscle spasms. 04/11/20   Benjiman Core, MD    Allergies    Patient has no known allergies.  Review of Systems   Review of Systems  Gastrointestinal:  Positive for vomiting.  All other systems reviewed and are negative.  Physical Exam Updated Vital Signs BP (!) 125/108   Pulse 65   Temp 97.7 F (36.5 C) (Oral)   Resp 16   Ht 1.753 m (5\' 9" )   Wt 60.3 kg   SpO2 96%   BMI 19.64 kg/m   Physical Exam Vitals and nursing note reviewed.  Constitutional:      General: He is not in acute distress.    Appearance: He is well-developed.  HENT:     Head: Normocephalic and atraumatic.     Right Ear: External ear normal.     Left Ear: External ear normal.  Eyes:     General: No scleral icterus.  Right eye: No discharge.        Left eye: No discharge.     Conjunctiva/sclera: Conjunctivae normal.  Neck:     Trachea: No tracheal deviation.  Cardiovascular:     Rate and Rhythm: Normal rate and regular rhythm.  Pulmonary:     Effort: Pulmonary effort is normal. No respiratory distress.     Breath sounds: Normal breath sounds. No stridor. No wheezing or rales.  Abdominal:     General: Bowel sounds are normal. There is no distension.     Palpations: Abdomen is soft.     Tenderness: There is no abdominal tenderness. There is no guarding or rebound.  Musculoskeletal:        General: No tenderness or deformity.     Cervical back: Neck supple.  Skin:    General: Skin is warm and dry.     Findings: No rash.  Neurological:     General: No focal deficit present.     Mental Status: He is  alert.     Cranial Nerves: No cranial nerve deficit (no facial droop, extraocular movements intact, no slurred speech).     Sensory: No sensory deficit.     Motor: No abnormal muscle tone or seizure activity.     Coordination: Coordination normal.  Psychiatric:        Mood and Affect: Mood normal.    ED Results / Procedures / Treatments   Labs (all labs ordered are listed, but only abnormal results are displayed) Labs Reviewed  COMPREHENSIVE METABOLIC PANEL - Abnormal; Notable for the following components:      Result Value   Calcium 8.8 (*)    All other components within normal limits  URINALYSIS, ROUTINE W REFLEX MICROSCOPIC - Abnormal; Notable for the following components:   Specific Gravity, Urine >1.030 (*)    All other components within normal limits  LIPASE, BLOOD  CBC    EKG None  Radiology No results found.  Procedures Procedures   Medications Ordered in ED Medications - No data to display  ED Course  I have reviewed the triage vital signs and the nursing notes.  Pertinent labs & imaging results that were available during my care of the patient were reviewed by me and considered in my medical decision making (see chart for details).  Clinical Course as of 09/15/21 1745  Fri Sep 15, 2021  1737 CBC is normal.  Lipase is normal.  Metabolic panel is normal.  Urinalysis is normal [JK]    Clinical Course User Index [JK] Linwood Dibbles, MD   MDM Rules/Calculators/A&P                           Patient presented with nausea and vomiting that occurred this morning.  He has been fine the rest of the morning as well as the afternoon.  His laboratory tests are reassuring.  His abdominal exam is benign.  No signs of severe dehydration.  Doubt obstruction appendicitis, otitis pancreatitis.  Will discharge home with prescription for Zofran. Final Clinical Impression(s) / ED Diagnoses Final diagnoses:  Nausea and vomiting, unspecified vomiting type    Rx / DC Orders ED  Discharge Orders          Ordered    ondansetron (ZOFRAN-ODT) 8 MG disintegrating tablet  Every 8 hours PRN        09/15/21 1743             Linwood Dibbles, MD 09/15/21  1745  

## 2021-09-15 NOTE — Discharge Instructions (Addendum)
Take Zofran as needed for nausea and vomiting.  Return if you start having trouble with pain fevers or other concerning symptoms.

## 2021-09-15 NOTE — ED Provider Notes (Signed)
Emergency Medicine Provider Triage Evaluation Note  Darryl Lloyd , a 30 y.o. male  was evaluated in triage.  Pt complains of epigastric abdominal pain and intractable nausea and vomiting started this morning.  History of similar symptoms in the past.  Denies any alcohol or drug use.  States he might of eaten some bad ribs last night.  No diarrhea.  No chest pain, no shortness of breath, no fever, no chills.  Review of Systems  Positive:  Negative: See above   Physical Exam  BP 101/73 (BP Location: Left Arm)   Pulse 89   Temp 97.7 F (36.5 C) (Oral)   Resp 18   Ht 5\' 9"  (1.753 m)   Wt 60.3 kg   SpO2 100%   BMI 19.64 kg/m  Gen:   Awake, no distress   Resp:  Normal effort  MSK:   Moves extremities without difficulty  Other:  Abdomen is nontender to palpation.  Medical Decision Making  Medically screening exam initiated at 12:58 PM.  Appropriate orders placed.  Rachard Isidro was informed that the remainder of the evaluation will be completed by another provider, this initial triage assessment does not replace that evaluation, and the importance of remaining in the ED until their evaluation is complete.     Waynetta Sandy, PA-C 09/15/21 1259    14/09/22, MD 09/15/21 [phone]

## 2021-11-13 ENCOUNTER — Emergency Department (HOSPITAL_COMMUNITY)
Admission: EM | Admit: 2021-11-13 | Discharge: 2021-11-13 | Disposition: A | Discharge status: Home / Self Care | Payer: No Typology Code available for payment source | Attending: Emergency Medicine | Admitting: Emergency Medicine

## 2021-11-13 ENCOUNTER — Encounter (HOSPITAL_COMMUNITY): Payer: Self-pay

## 2021-11-13 ENCOUNTER — Other Ambulatory Visit: Payer: Self-pay

## 2021-11-13 ENCOUNTER — Emergency Department (HOSPITAL_COMMUNITY): Payer: No Typology Code available for payment source

## 2021-11-13 DIAGNOSIS — Y92410 Unspecified street and highway as the place of occurrence of the external cause: Secondary | ICD-10-CM | POA: Insufficient documentation

## 2021-11-13 DIAGNOSIS — S6992XA Unspecified injury of left wrist, hand and finger(s), initial encounter: Secondary | ICD-10-CM | POA: Diagnosis present

## 2021-11-13 DIAGNOSIS — Z79899 Other long term (current) drug therapy: Secondary | ICD-10-CM | POA: Insufficient documentation

## 2021-11-13 DIAGNOSIS — M546 Pain in thoracic spine: Secondary | ICD-10-CM

## 2021-11-13 DIAGNOSIS — S62347A Nondisplaced fracture of base of fifth metacarpal bone. left hand, initial encounter for closed fracture: Secondary | ICD-10-CM

## 2021-11-13 MED ORDER — OXYCODONE-ACETAMINOPHEN 5-325 MG PO TABS
1.0000 | ORAL_TABLET | Freq: Once | ORAL | Status: AC
  Administered 2021-11-13: 1 via ORAL
  Filled 2021-11-13: qty 1

## 2021-11-13 MED ORDER — METHOCARBAMOL 500 MG PO TABS: 500.0000 mg | ORAL_TABLET | Freq: Two times a day (BID) | ORAL | 0 refills | Status: AC

## 2021-11-13 MED ORDER — NAPROXEN 500 MG PO TABS: 500.0000 mg | ORAL_TABLET | Freq: Two times a day (BID) | ORAL | 0 refills | Status: AC

## 2021-11-13 NOTE — Discharge Instructions (Addendum)
Please pick up medications and take as prescribed. DO NOT DRIVE OR DRINK ALCOHOL WHILE TAKING THE MUSCLE RELAXER AS IT CAN MAKE YOU DROWSY. I would recommend taking it at nighttime to help you sleep and to take the antiinflammatory during the daytime.   Follow up with Dr. Merlyn Lot for further evaluation of your left hand fracture. Keep splint on until you can be seen. DO NOT GET SPLINT WET.   While at home please rest, ice, and elevate your hand to help reduce pain/inflammation.   Return to the ED for any new/worsening symptoms otherwise follow up with your PCP. If you do not have a PCP you can follow up with [facility] for primary care needs.

## 2021-11-13 NOTE — ED Notes (Signed)
Ortho at [facility] for splint
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
## 2021-11-13 NOTE — Progress Notes (Signed)
Orthopedic Tech Progress Note Patient Details:  Darryl Lloyd [birth year] [id number]  Ortho Devices Type of Ortho Device: Ulna gutter splint Ortho Device/Splint Location: LUE Ortho Device/Splint Interventions: Ordered, Application   Post Interventions Patient Tolerated: Well Instructions Provided: Care of device  [name] 11/13/2021, 8:20 AM
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
## 2021-11-14 ENCOUNTER — Other Ambulatory Visit: Payer: Self-pay

## 2021-11-14 ENCOUNTER — Encounter (HOSPITAL_COMMUNITY): Payer: Self-pay

## 2021-11-14 ENCOUNTER — Emergency Department (HOSPITAL_COMMUNITY)
Admission: EM | Admit: 2021-11-14 | Discharge: 2021-11-14 | Disposition: A | Discharge status: Home / Self Care | Payer: No Typology Code available for payment source | Attending: Emergency Medicine | Admitting: Emergency Medicine

## 2021-11-14 ENCOUNTER — Emergency Department (HOSPITAL_COMMUNITY)
Admission: EM | Admit: 2021-11-14 | Discharge: 2021-11-14 | Disposition: A | Discharge status: Home / Self Care | Payer: Self-pay | Attending: Emergency Medicine | Admitting: Emergency Medicine

## 2021-11-14 DIAGNOSIS — M25542 Pain in joints of left hand: Secondary | ICD-10-CM | POA: Insufficient documentation

## 2021-11-14 DIAGNOSIS — S62347A Nondisplaced fracture of base of fifth metacarpal bone. left hand, initial encounter for closed fracture: Secondary | ICD-10-CM | POA: Diagnosis not present

## 2021-11-14 DIAGNOSIS — Z5321 Procedure and treatment not carried out due to patient leaving prior to being seen by health care provider: Secondary | ICD-10-CM | POA: Insufficient documentation

## 2021-11-14 DIAGNOSIS — S6992XA Unspecified injury of left wrist, hand and finger(s), initial encounter: Secondary | ICD-10-CM | POA: Diagnosis present

## 2021-11-14 DIAGNOSIS — S62339A Displaced fracture of neck of unspecified metacarpal bone, initial encounter for closed fracture: Secondary | ICD-10-CM

## 2021-11-14 MED ORDER — OXYCODONE-ACETAMINOPHEN 5-325 MG PO TABS
1.0000 | ORAL_TABLET | Freq: Once | ORAL | Status: AC
  Administered 2021-11-14: 1 via ORAL
  Filled 2021-11-14: qty 1
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
## 2021-11-14 NOTE — ED Notes (Signed)
Patient states he is discharging himself and that he is leaving
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
## 2021-11-14 NOTE — Discharge Instructions (Signed)
It was a pleasure taking care of you today. Please pick up your pain medication from your pharmacy you were prescribed yesterday. Call the orthopedic surgeon tomorrow to schedule an appointment for further evaluation. Return to the ER for new or worsening symptoms.
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
## 2022-04-01 ENCOUNTER — Emergency Department (HOSPITAL_BASED_OUTPATIENT_CLINIC_OR_DEPARTMENT_OTHER)
Admission: EM | Admit: 2022-04-01 | Discharge: 2022-04-01 | Disposition: A | Discharge status: Home / Self Care | Payer: Self-pay | Attending: Emergency Medicine | Admitting: Emergency Medicine

## 2022-04-01 ENCOUNTER — Encounter (HOSPITAL_BASED_OUTPATIENT_CLINIC_OR_DEPARTMENT_OTHER): Payer: Self-pay | Admitting: Emergency Medicine

## 2022-04-01 ENCOUNTER — Other Ambulatory Visit: Payer: Self-pay

## 2022-04-01 DIAGNOSIS — W57XXXA Bitten or stung by nonvenomous insect and other nonvenomous arthropods, initial encounter: Secondary | ICD-10-CM | POA: Insufficient documentation

## 2022-04-01 DIAGNOSIS — S50861A Insect bite (nonvenomous) of right forearm, initial encounter: Secondary | ICD-10-CM | POA: Insufficient documentation

## 2022-04-01 DIAGNOSIS — L03113 Cellulitis of right upper limb: Secondary | ICD-10-CM | POA: Insufficient documentation

## 2022-04-01 MED ORDER — CEPHALEXIN 500 MG PO CAPS: 500.0000 mg | ORAL_CAPSULE | Freq: Four times a day (QID) | ORAL | 0 refills | Status: AC

## 2022-04-01 NOTE — ED Provider Notes (Signed)
[facility] EMERGENCY DEPARTMENT Provider Note   CSN: [id number] Arrival date & time: 04/01/22  1533     History  Chief Complaint  Patient presents with   Insect Bite    Darryl Lloyd is a 31 y.o. male who presents to the emergency department for possible insect bite on the right wrist.  Patient states that he was outside 2 days ago and thought he got bit on his right forearm.  He noticed a small whitehead, and states that he popped it.  Starting today he woke up with more redness and swelling of the area, making it slightly painful to move his wrist.  No fever.  No rash anywhere else.  HPI     Home Medications Prior to Admission medications   Medication Sig Start Date End Date Taking? Authorizing Provider  cephALEXin (KEFLEX) 500 MG capsule Take 1 capsule (500 mg total) by mouth 4 (four) times daily. 04/01/22  Yes Margene Cherian T, PA-C  bacitracin ointment Apply 1 application topically every 8 (eight) hours. 08/16/19   Myrtie Neither, MD  dicyclomine (BENTYL) 20 MG tablet Take 1 tablet (20 mg total) by mouth 2 (two) times daily. 09/11/20   Mare Ferrari, PA-C  HYDROcodone-acetaminophen (NORCO/VICODIN) 5-325 MG tablet Take 1 tablet by mouth every 4 (four) hours as needed for pain. 08/03/19   [provider]  methocarbamol (ROBAXIN) 500 MG tablet Take 1 tablet (500 mg total) by mouth every 8 (eight) hours as needed for muscle spasms. 04/11/20   Benjiman Core, MD  methocarbamol (ROBAXIN) 500 MG tablet Take 1 tablet (500 mg total) by mouth 2 (two) times daily. 11/13/21   Hyman Hopes, Margaux, PA-C  naproxen (NAPROSYN) 500 MG tablet Take 1 tablet (500 mg total) by mouth 2 (two) times daily. 11/13/21   Hyman Hopes, Margaux, PA-C  ondansetron (ZOFRAN-ODT) 8 MG disintegrating tablet Take 1 tablet (8 mg total) by mouth every 8 (eight) hours as needed for nausea or vomiting. 09/15/21   Linwood Dibbles, MD      Allergies    Patient has no known allergies.    Review of Systems    Review of Systems  Constitutional:  Negative for fever.  Skin:  Positive for color change.  All other systems reviewed and are negative.   Physical Exam Updated Vital Signs BP 127/86   Pulse (!) 116   Temp 98 F (36.7 C) (Oral)   Resp 18   Ht 5\' 9"  (1.753 m)   Wt 61.2 kg   SpO2 100%   BMI 19.94 kg/m  Physical Exam Vitals and nursing note reviewed.  Constitutional:      Appearance: Normal appearance.  HENT:     Head: Normocephalic and atraumatic.  Eyes:     Conjunctiva/sclera: Conjunctivae normal.  Pulmonary:     Effort: Pulmonary effort is normal. No respiratory distress.  Skin:    General: Skin is warm and dry.     Comments: Small papular lesion noted to the ventral right forearm, with surrounding erythema.  No fluctuance or purulence.  Neurological:     Mental Status: He is alert.  Psychiatric:        Mood and Affect: Mood normal.        Behavior: Behavior normal.     ED Results / Procedures / Treatments   Labs (all labs ordered are listed, but only abnormal results are displayed) Labs Reviewed - No data to display  EKG None  Radiology No results found.  Procedures Procedures  Medications Ordered in ED Medications - No data to display  ED Course/ Medical Decision Making/ A&P                           Medical Decision Making This patient is a 31 y.o. male who presents to the ED for concern of insect bite.   Physical Exam: Physical exam performed. The pertinent findings include: Small papular lesion noted to the ventral right forearm, with surrounding erythema. Appears cellulitic.  Disposition: After consideration of the diagnostic results and the patients response to treatment, I feel that emergency department workup does not suggest an emergent condition requiring admission or immediate intervention beyond what has been performed at this time. The plan is: to treat with antibiotics and hydrocortisone cream for infected insect bite. The patient  is safe for discharge and has been instructed to return immediately for worsening symptoms, change in symptoms or any other concerns.   Final Clinical Impression(s) / ED Diagnoses Final diagnoses:  Insect bite of right forearm, initial encounter  Cellulitis of right upper extremity    Rx / DC Orders ED Discharge Orders          Ordered    cephALEXin (KEFLEX) 500 MG capsule  4 times daily        04/01/22 1616           Portions of this report may have been transcribed using voice recognition software. Every effort was made to ensure accuracy; however, inadvertent computerized transcription errors may be present.    Jeanella Flattery 04/01/22 1620    Maia Plan, MD 04/04/22 1019

## 2022-07-10 ENCOUNTER — Emergency Department (HOSPITAL_BASED_OUTPATIENT_CLINIC_OR_DEPARTMENT_OTHER): Payer: Self-pay

## 2022-07-10 ENCOUNTER — Other Ambulatory Visit: Payer: Self-pay

## 2022-07-10 ENCOUNTER — Encounter (HOSPITAL_BASED_OUTPATIENT_CLINIC_OR_DEPARTMENT_OTHER): Payer: Self-pay | Admitting: Pediatrics

## 2022-07-10 ENCOUNTER — Emergency Department (HOSPITAL_BASED_OUTPATIENT_CLINIC_OR_DEPARTMENT_OTHER)
Admission: EM | Admit: 2022-07-10 | Discharge: 2022-07-10 | Disposition: A | Discharge status: Home / Self Care | Payer: Self-pay | Attending: Emergency Medicine | Admitting: Emergency Medicine

## 2022-07-10 DIAGNOSIS — B9789 Other viral agents as the cause of diseases classified elsewhere: Secondary | ICD-10-CM | POA: Insufficient documentation

## 2022-07-10 DIAGNOSIS — J069 Acute upper respiratory infection, unspecified: Secondary | ICD-10-CM | POA: Insufficient documentation

## 2022-07-10 DIAGNOSIS — Z20822 Contact with and (suspected) exposure to covid-19: Secondary | ICD-10-CM | POA: Insufficient documentation

## 2022-07-10 DIAGNOSIS — R197 Diarrhea, unspecified: Secondary | ICD-10-CM | POA: Insufficient documentation

## 2022-07-10 DIAGNOSIS — R0602 Shortness of breath: Secondary | ICD-10-CM

## 2022-07-10 LAB — RESP PANEL BY RT-PCR (FLU A&B, COVID) ARPGX2
Influenza A by PCR: NEGATIVE
Influenza B by PCR: NEGATIVE
SARS Coronavirus 2 by RT PCR: NEGATIVE

## 2022-07-10 MED ORDER — ALBUTEROL SULFATE HFA 108 (90 BASE) MCG/ACT IN AERS: 1.0000 | INHALATION_SPRAY | Freq: Four times a day (QID) | RESPIRATORY_TRACT | 0 refills | Status: AC | PRN

## 2022-07-10 NOTE — ED Triage Notes (Signed)
C/O cough, shortness of breath, diarrhea and fever since yesterday. Stated took some nyquil with min effect.

## 2022-07-10 NOTE — Discharge Instructions (Addendum)
Your symptoms are consistent with an upper respiratory infection of viral origin with some diarrhea.  I prescribed some medications to help with your symptoms as well as I recommend over-the-counter Mucinex, and cold nasal saline.  You can use some over-the-counter Imodium to help with the diarrhea, I recommend that you drink plenty of fluids, such as Gatorade, Pedialyte.  I suspect that your symptoms will resolve in the next few days with rest and conservative management.

## 2022-07-10 NOTE — ED Provider Notes (Signed)
[facility] EMERGENCY DEPARTMENT Provider Note   CSN: [id number] Arrival date & time: 07/10/22  1116     History  Chief Complaint  Patient presents with   Cough   Diarrhea   Shortness of Breath    Darryl Lloyd is a 31 y.o. male with noncontributory past medical history who presents with upper respiratory infectious symptoms, feeling of congestion/difficulty breathing.  Patient reports that it feels like a cold but he is having slightly more difficulty breathing than normal.  Patient reports no known sick contacts.  He has not tested himself for COVID, flu at home.  He reports he has had some diarrhea x2-3 over the last 2 days.  He denies significant fever, chills.  He has not tried any over-the-counter medication for symptoms.  No previous history of asthma.   Cough Associated symptoms: shortness of breath   Diarrhea Shortness of Breath Associated symptoms: cough        Home Medications Prior to Admission medications   Medication Sig Start Date End Date Taking? Authorizing Provider  albuterol (VENTOLIN HFA) 108 (90 Base) MCG/ACT inhaler Inhale 1-2 puffs into the lungs every 6 (six) hours as needed for wheezing or shortness of breath. 07/10/22  Yes Atari Novick H, PA-C  bacitracin ointment Apply 1 application topically every 8 (eight) hours. 08/16/19   Myrtie Neither, MD  cephALEXin (KEFLEX) 500 MG capsule Take 1 capsule (500 mg total) by mouth 4 (four) times daily. 04/01/22   Roemhildt, Lorin T, PA-C  dicyclomine (BENTYL) 20 MG tablet Take 1 tablet (20 mg total) by mouth 2 (two) times daily. 09/11/20   Mare Ferrari, PA-C  HYDROcodone-acetaminophen (NORCO/VICODIN) 5-325 MG tablet Take 1 tablet by mouth every 4 (four) hours as needed for pain. 08/03/19   [provider]  methocarbamol (ROBAXIN) 500 MG tablet Take 1 tablet (500 mg total) by mouth every 8 (eight) hours as needed for muscle spasms. 04/11/20   Benjiman Core, MD  methocarbamol (ROBAXIN)  500 MG tablet Take 1 tablet (500 mg total) by mouth 2 (two) times daily. 11/13/21   Hyman Hopes, Margaux, PA-C  naproxen (NAPROSYN) 500 MG tablet Take 1 tablet (500 mg total) by mouth 2 (two) times daily. 11/13/21   Hyman Hopes, Margaux, PA-C  ondansetron (ZOFRAN-ODT) 8 MG disintegrating tablet Take 1 tablet (8 mg total) by mouth every 8 (eight) hours as needed for nausea or vomiting. 09/15/21   Linwood Dibbles, MD      Allergies    Patient has no known allergies.    Review of Systems   Review of Systems  Respiratory:  Positive for cough and shortness of breath.   Gastrointestinal:  Positive for diarrhea.  All other systems reviewed and are negative.   Physical Exam Updated Vital Signs BP 132/89   Pulse 86   Temp 98.4 F (36.9 C) (Oral)   Resp 18   Ht 5\' 10"  (1.778 m)   Wt 61.2 kg   SpO2 100%   BMI 19.37 kg/m  Physical Exam Vitals and nursing note reviewed.  Constitutional:      General: He is not in acute distress.    Appearance: Normal appearance.  HENT:     Head: Normocephalic and atraumatic.     Comments: No significant posterior oropharynx erythema, swelling, exudate. Uvula midline, tonsils 1+ bilaterally.  No trismus, stridor, evidence of PTA, floor of mouth swelling or redness.   Eyes:     General:        Right eye: No discharge.  Left eye: No discharge.  Cardiovascular:     Rate and Rhythm: Normal rate and regular rhythm.  Pulmonary:     Effort: Pulmonary effort is normal. No respiratory distress.     Comments: No significant wheezing, rhonchi, stridor, rales.  No respiratory distress.  Some upper nasal congestion noted. Musculoskeletal:        General: No deformity.  Skin:    General: Skin is warm and dry.  Neurological:     Mental Status: He is alert and oriented to person, place, and time.  Psychiatric:        Mood and Affect: Mood normal.        Behavior: Behavior normal.     ED Results / Procedures / Treatments   Labs (all labs ordered are listed, but only  abnormal results are displayed) Labs Reviewed  RESP PANEL BY RT-PCR (FLU A&B, COVID) [id number]    EKG None  Radiology DG Chest 2 View  Result Date: 07/10/2022 CLINICAL DATA:  Cough, shortness of breath EXAM: CHEST - 2 VIEW COMPARISON:  10/14/2014 FINDINGS: The heart size and mediastinal contours are within normal limits. Both lungs are clear. The visualized skeletal structures are unremarkable. IMPRESSION: No active cardiopulmonary disease. Electronically Signed   By: Elmer Picker M.D.   On: 07/10/2022 12:17    Procedures Procedures    Medications Ordered in ED Medications - No data to display  ED Course/ Medical Decision Making/ A&P                           Medical Decision Making Amount and/or Complexity of Data Reviewed Radiology: ordered.   This is an overall well-appearing 31 year old male with overall upper respiratory infection symptoms, he endorses some congestion as well as diarrhea.  Independently interpreted lab work, imaging including chest x-ray which shows no evidence of intrathoracic abnormality as well as RVP which is negative for COVID, flu.  I agree with the radiologist interpretation of plain film radiographs and independently interpreted the myself.  On exam patient with no respiratory distress, normal appearance of oropharynx.  His vital signs are stable despite no administration of Tylenol or other over-the-counter antipyretic.  His symptoms are consistent with upper respiratory infection of viral origin plus or minus a viral gastroenteritis.  As he has overall benign appearance in the emergency department recommend conservative management with over-the-counter medication will provide inhaler to help with feeling of shortness of breath, although patient has no wheezing on exam.  Patient encouraged to follow-up with PCP, drink plenty of fluids, rest. Final Clinical Impression(s) / ED Diagnoses Final diagnoses:  Viral upper respiratory tract infection   Shortness of breath  Diarrhea, unspecified type    Rx / DC Orders ED Discharge Orders          Ordered    albuterol (VENTOLIN HFA) 108 (90 Base) MCG/ACT inhaler  Every 6 hours PRN        07/10/22 1457              Regenia Erck, [place] H, PA-C 07/10/22 Byron, Brooks, DO 07/10/22 1508

## 2023-05-28 ENCOUNTER — Other Ambulatory Visit: Payer: Self-pay

## 2023-05-28 ENCOUNTER — Encounter (HOSPITAL_COMMUNITY): Payer: Self-pay | Admitting: Emergency Medicine

## 2023-05-28 ENCOUNTER — Emergency Department (HOSPITAL_COMMUNITY)
Admission: EM | Admit: 2023-05-28 | Discharge: 2023-05-29 | Disposition: A | Discharge status: Home / Self Care | Payer: Self-pay | Attending: Emergency Medicine | Admitting: Emergency Medicine

## 2023-05-28 DIAGNOSIS — S025XXB Fracture of tooth (traumatic), initial encounter for open fracture: Secondary | ICD-10-CM | POA: Insufficient documentation

## 2023-05-28 DIAGNOSIS — X58XXXA Exposure to other specified factors, initial encounter: Secondary | ICD-10-CM | POA: Insufficient documentation

## 2023-05-28 MED ORDER — ACETAMINOPHEN 325 MG PO TABS
650.0000 mg | ORAL_TABLET | Freq: Once | ORAL | Status: AC | PRN
  Administered 2023-05-28: 650 mg via ORAL

## 2023-05-28 MED ORDER — ACETAMINOPHEN 325 MG PO TABS
ORAL_TABLET | ORAL | Status: AC
  Filled 2023-05-28: qty 2

## 2023-05-28 NOTE — ED Triage Notes (Signed)
Patient reports he cracked his back upper tooth on the left side a few days ago and started having excruciatingly pain. Crying and writhing around due to pain in triage. Difficult to assess the patient.

## 2023-05-29 MED ORDER — OXYCODONE HCL 5 MG PO TABS
5.0000 mg | ORAL_TABLET | Freq: Once | ORAL | Status: AC
  Administered 2023-05-29: 5 mg via ORAL
  Filled 2023-05-29: qty 1

## 2023-05-29 MED ORDER — AMOXICILLIN-POT CLAVULANATE 875-125 MG PO TABS
1.0000 | ORAL_TABLET | Freq: Once | ORAL | Status: AC
  Administered 2023-05-29: 1 via ORAL
  Filled 2023-05-29: qty 1

## 2023-05-29 MED ORDER — AMOXICILLIN-POT CLAVULANATE 875-125 MG PO TABS: 1.0000 | ORAL_TABLET | Freq: Two times a day (BID) | ORAL | 0 refills | Status: AC

## 2023-05-29 NOTE — ED Provider Notes (Signed)
[place] EMERGENCY DEPARTMENT AT [facility] Provider Note   CSN: [id number] Arrival date & time: 05/28/23  2129     History  Chief Complaint  Patient presents with   Dental Pain    Darryl Lloyd is a 32 y.o. male with no PMH who presents to the ED complaining of dental pain.  Notes that he accidentally broke one of his right upper teeth and today he had a severe, shooting pain that was debilitating the problem to the ED.  No fever, body aches, nausea, vomiting, difficulty breathing or swallowing.  Not currently on any antibiotics.  Does not currently have follow-up with a dentist.  Currently pain is well-controlled after receiving Tylenol in triage.      Home Medications Prior to Admission medications   Medication Sig Start Date End Date Taking? Authorizing Provider  amoxicillin-clavulanate (AUGMENTIN) 875-125 MG tablet Take 1 tablet by mouth every 12 (twelve) hours for 7 days. 05/29/23 06/05/23 Yes Cadi Rhinehart L, PA-C  albuterol (VENTOLIN HFA) 108 (90 Base) MCG/ACT inhaler Inhale 1-2 puffs into the lungs every 6 (six) hours as needed for wheezing or shortness of breath. 07/10/22   Prosperi, Christian H, PA-C  bacitracin ointment Apply 1 application topically every 8 (eight) hours. 08/16/19   Myrtie Neither, MD  cephALEXin (KEFLEX) 500 MG capsule Take 1 capsule (500 mg total) by mouth 4 (four) times daily. 04/01/22   Roemhildt, Lorin T, PA-C  dicyclomine (BENTYL) 20 MG tablet Take 1 tablet (20 mg total) by mouth 2 (two) times daily. 09/11/20   Mare Ferrari, PA-C  HYDROcodone-acetaminophen (NORCO/VICODIN) 5-325 MG tablet Take 1 tablet by mouth every 4 (four) hours as needed for pain. 08/03/19   [provider]  methocarbamol (ROBAXIN) 500 MG tablet Take 1 tablet (500 mg total) by mouth every 8 (eight) hours as needed for muscle spasms. 04/11/20   Benjiman Core, MD  methocarbamol (ROBAXIN) 500 MG tablet Take 1 tablet (500 mg total) by mouth 2 (two) times daily.  11/13/21   Hyman Hopes, Margaux, PA-C  naproxen (NAPROSYN) 500 MG tablet Take 1 tablet (500 mg total) by mouth 2 (two) times daily. 11/13/21   Hyman Hopes, Margaux, PA-C  ondansetron (ZOFRAN-ODT) 8 MG disintegrating tablet Take 1 tablet (8 mg total) by mouth every 8 (eight) hours as needed for nausea or vomiting. 09/15/21   Linwood Dibbles, MD      Allergies    Patient has no known allergies.    Review of Systems   Review of Systems  All other systems reviewed and are negative.   Physical Exam Updated Vital Signs BP 99/63 (BP Location: Right Arm)   Pulse 61   Temp 98.7 F (37.1 C) (Oral)   Resp 16   Ht 5\' 10"  (1.778 m)   Wt 61.2 kg   SpO2 98%   BMI 19.36 kg/m  Physical Exam Vitals and nursing note reviewed.  Constitutional:      General: He is not in acute distress.    Appearance: Normal appearance.  HENT:     Head: Normocephalic and atraumatic.     Mouth/Throat:     Mouth: Mucous membranes are moist.     Comments: Broken tooth #7 with tenderness to palpation, no gingival erythema, swelling, induration, or fluctuance, widely patent airway, no oropharyngeal lesions or swelling, normal phonation, no stridor Eyes:     Conjunctiva/sclera: Conjunctivae normal.  Cardiovascular:     Rate and Rhythm: Normal rate and regular rhythm.  Pulmonary:     Effort:  Pulmonary effort is normal.     Breath sounds: Normal breath sounds.  Abdominal:     General: Abdomen is flat.     Palpations: Abdomen is soft.     Tenderness: There is no abdominal tenderness.  Musculoskeletal:        General: Normal range of motion.     Cervical back: Normal range of motion and neck supple. No rigidity.     Right lower leg: No edema.     Left lower leg: No edema.  Skin:    General: Skin is warm and dry.     Capillary Refill: Capillary refill takes less than 2 seconds.  Neurological:     Mental Status: He is alert. Mental status is at baseline.  Psychiatric:        Behavior: Behavior normal.     ED Results /  Procedures / Treatments   Labs (all labs ordered are listed, but only abnormal results are displayed) Labs Reviewed - No data to display  EKG None  Radiology No results found.  Procedures Procedures    Medications Ordered in ED Medications  oxyCODONE (Oxy IR/ROXICODONE) immediate release tablet 5 mg (has no administration in time range)  amoxicillin-clavulanate (AUGMENTIN) 875-125 MG per tablet 1 tablet (has no administration in time range)  acetaminophen (TYLENOL) tablet 650 mg (650 mg Oral Given 05/28/23 2155)    ED Course/ Medical Decision Making/ A&P                                 Medical Decision Making Risk Prescription drug management.   Medical Decision Making:   Darryl Lloyd is a 32 y.o. male who presented to the ED today with dental pain detailed above.    Complete initial physical exam performed, notably the patient was HDS in no acute distress. No obvious intraoral lesions or swelling. Poor dentition diffusely. Obvious broken tooth with tenderness. No abscess.    Reviewed and confirmed nursing documentation for past medical history, family history, social history.    Initial Assessment:   With the patient's presentation of dental pain, most likely diagnosis is reversible vs irreversible pulpitis. Other diagnoses were considered including (but not limited to) ludwig's angina, osteitis, dental abscess, PTA, RPA. These are considered less likely due to history of present illness and physical exam findings.   This is most consistent with an acute complicated illness  Initial Plan:  Symptomatic management Due to clinical overlap with potentially reversible pulpitis, antibiotics prescribed Patient will need definitive management with dentistry. Provided low cost/local dental resource chart provided by social work.  Disposition:  I have considered need for hospitalization, however, considering all of the above, I believe this patient is stable for discharge at  this time.  Patient/family educated about specific return precautions for given chief complaint and symptoms.  Patient/family educated about follow-up with PCP and dentistry.    Patient/family expressed understanding of return precautions and need for follow-up. Patient spoken to regarding all imaging and laboratory results and appropriate follow up for these results. All education provided in verbal form with additional information in written form. Time was allowed for answering of patient questions. Patient discharged.              Final Clinical Impression(s) / ED Diagnoses Final diagnoses:  Open fracture of tooth, initial encounter    Rx / DC Orders ED Discharge Orders          Ordered  amoxicillin-clavulanate (AUGMENTIN) 875-125 MG tablet  Every 12 hours        05/29/23 0227              Tonette Lederer, PA-C 05/29/23 3244    Shon Baton, MD 05/31/23 [phone]

## 2023-05-29 NOTE — ED Notes (Signed)
Assumed care of patient here c/o broken tooth . Patient given tylenol in triage. Patient sleeping in bed arousable to voice. Pt a/o x 4 respirations even and non labored

## 2023-05-29 NOTE — Discharge Instructions (Addendum)
Thank you for letting us take care of you today.  We will start you on antibiotics to cover for a dental infection.  We gave you your first dose tonight.  You may start this prescription tomorrow.  You will ultimately need to follow-up with a dentist.  I provided our dentist on-call.  Please call his office within 48 hours to schedule a follow-up appointment.  For pain, you may take up to Tylenol 1000 mg and/or ibuprofen 600 mg every 6 hours as needed.  Do not take more than 4000 mg Tylenol or 3200 mg ibuprofen in 24 hours as this can cause damage to your liver and kidneys.  For new or worsening condition including fever, difficulty breathing or swallowing, swelling of your neck, severe neck pain, or other new, concerning symptoms, return to the nearest ED for reevaluation.

## 2024-04-06 ENCOUNTER — Emergency Department (HOSPITAL_BASED_OUTPATIENT_CLINIC_OR_DEPARTMENT_OTHER)
Admission: EM | Admit: 2024-04-06 | Discharge: 2024-04-06 | Disposition: A | Discharge status: Home / Self Care | Payer: Self-pay | Attending: Emergency Medicine | Admitting: Emergency Medicine

## 2024-04-06 ENCOUNTER — Encounter (HOSPITAL_BASED_OUTPATIENT_CLINIC_OR_DEPARTMENT_OTHER): Payer: Self-pay | Admitting: Emergency Medicine

## 2024-04-06 ENCOUNTER — Other Ambulatory Visit: Payer: Self-pay

## 2024-04-06 DIAGNOSIS — R1110 Vomiting, unspecified: Secondary | ICD-10-CM | POA: Insufficient documentation

## 2024-04-06 DIAGNOSIS — R519 Headache, unspecified: Secondary | ICD-10-CM | POA: Insufficient documentation

## 2024-04-06 MED ORDER — DIPHENHYDRAMINE HCL 25 MG PO TABS: 25.0000 mg | ORAL_TABLET | Freq: Four times a day (QID) | ORAL | 0 refills | Status: AC | PRN

## 2024-04-06 MED ORDER — METOCLOPRAMIDE HCL 10 MG PO TABS
10.0000 mg | ORAL_TABLET | Freq: Four times a day (QID) | ORAL | 0 refills | Status: AC
Start: 2024-04-06 — End: ?

## 2024-04-06 NOTE — ED Provider Notes (Signed)
 [place] EMERGENCY DEPARTMENT AT [facility] Provider Note   CSN: [id number] Arrival date & time: 04/06/24  1228     Patient presents with: Headache   Darryl Lloyd is a 33 y.o. male.    Headache  Patient is a 33 year old male with no pertinent past medical history present emergency room today with complaints of headache that began this morning.  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  He states that he felt nauseous this morning along with his headache and had 2 episodes of small-volume clear emesis no hematemesis.  He denies any abdominal pain chest pain or difficulty breathing.  He states his headache has dramatically improved he states that he has no blurry vision double vision.  He describes the headache as the front of his head.  States it is achy moderate and constant but is been gradually improving.  Denies any neck pain no slurred speech confusion no head injuries no numbness or weakness in any extremities.  He states he does not have headaches very often.  He states he has not eaten very much food today.  Denies any nausea currently.    Prior to Admission medications   Medication Sig Start Date End Date Taking? Authorizing Provider  diphenhydrAMINE (BENADRYL) 25 MG tablet Take 1 tablet (25 mg total) by mouth every 6 (six) hours as needed. 04/06/24  Yes Antavion Bartoszek S, PA  metoCLOPramide (REGLAN) 10 MG tablet Take 1 tablet (10 mg total) by mouth every 6 (six) hours. 04/06/24  Yes Kinsley Nicklaus, Hamp S, PA  albuterol  (VENTOLIN  HFA) 108 (90 Base) MCG/ACT inhaler Inhale 1-2 puffs into the lungs every 6 (six) hours as needed for wheezing or shortness of breath. 07/10/22   Prosperi, Christian H, PA-C  bacitracin  ointment Apply 1 application topically every 8 (eight) hours. 08/16/19   Marilee Leach, MD  cephALEXin  (KEFLEX ) 500 MG capsule Take 1 capsule (500 mg total) by mouth 4 (four) times daily. 04/01/22   Roemhildt, Lorin T, PA-C   dicyclomine  (BENTYL ) 20 MG tablet Take 1 tablet (20 mg total) by mouth 2 (two) times daily. 09/11/20   Vonn Hadassah LABOR, PA-C  HYDROcodone -acetaminophen  (NORCO/VICODIN) 5-325 MG tablet Take 1 tablet by mouth every 4 (four) hours as needed for pain. 08/03/19   [provider]  methocarbamol  (ROBAXIN ) 500 MG tablet Take 1 tablet (500 mg total) by mouth every 8 (eight) hours as needed for muscle spasms. 04/11/20   Patsey Lot, MD  methocarbamol  (ROBAXIN ) 500 MG tablet Take 1 tablet (500 mg total) by mouth 2 (two) times daily. 11/13/21   Shepard, Margaux, PA-C  naproxen  (NAPROSYN ) 500 MG tablet Take 1 tablet (500 mg total) by mouth 2 (two) times daily. 11/13/21   Venter, Margaux, PA-C  ondansetron  (ZOFRAN -ODT) 8 MG disintegrating tablet Take 1 tablet (8 mg total) by mouth every 8 (eight) hours as needed for nausea or vomiting. 09/15/21   Randol Simmonds, MD    Allergies: Patient has no known allergies.    Review of Systems  Neurological:  Positive for headaches.    Updated Vital Signs BP (!) 110/59 (BP Location: Right Arm)   Pulse 63   Temp 98.2 F (36.8 C)   Resp 18   Ht 5' 10 (1.778 m)   Wt 67.1 kg   SpO2 100%   BMI 21.24 kg/m   Physical Exam Vitals and nursing note reviewed.  Constitutional:      General: He is not in  acute distress. HENT:     Head: Normocephalic and atraumatic.     Nose: Nose normal.     Mouth/Throat:     Mouth: Mucous membranes are dry.   Eyes:     General: No scleral icterus.    Pupils: Pupils are equal, round, and reactive to light.    Cardiovascular:     Rate and Rhythm: Normal rate and regular rhythm.     Pulses: Normal pulses.     Heart sounds: Normal heart sounds.  Pulmonary:     Effort: Pulmonary effort is normal. No respiratory distress.     Breath sounds: No wheezing.  Abdominal:     Palpations: Abdomen is soft.     Tenderness: There is no abdominal tenderness.   Musculoskeletal:     Cervical back: Normal range of motion.     Right  lower leg: No edema.     Left lower leg: No edema.   Skin:    General: Skin is warm and dry.     Capillary Refill: Capillary refill takes less than 2 seconds.   Neurological:     Mental Status: He is alert. Mental status is at baseline.     Comments: Alert and oriented to self, place, time and event.   Speech is fluent, clear without dysarthria or dysphasia.   Strength 5/5 in upper/lower extremities   Sensation intact in upper/lower extremities   CN I not tested  CN II grossly intact visual fields bilaterally. Did not visualize posterior eye.  CN III, IV, VI PERRLA and EOMs intact bilaterally  CN V Intact sensation to sharp and light touch to the face  CN VII facial movements symmetric  CN VIII not tested  CN IX, X no uvula deviation, symmetric rise of soft palate  CN XI 5/5 SCM and trapezius strength bilaterally  CN XII Midline tongue protrusion, symmetric L/R movements   Psychiatric:        Mood and Affect: Mood normal.        Behavior: Behavior normal.     (all labs ordered are listed, but only abnormal results are displayed) Labs Reviewed - No data to display  EKG: None  Radiology: No results found.   Procedures   Medications Ordered in the ED - No data to display  Clinical Course as of 04/06/24 1637  Mon Apr 06, 2024  1420 Woke up with headache this AM. Vomited twice this AM and headache improved. No nausea now.   [WF]    Clinical Course User Index [WF] Neldon Hamp RAMAN, [place]                                 Medical Decision Making Risk OTC drugs. Prescription drug management.   Patient is a 33 year old male with no pertinent past medical history present emergency room today with complaints of headache that began this morning.  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  He states that he felt nauseous this morning along with his headache and had 2 episodes of small-volume clear emesis no hematemesis.  He denies any  abdominal pain chest pain or difficulty breathing.  He states his headache has dramatically improved he states that he has no blurry vision double vision.  He describes the headache as the front of his head.  States it is achy moderate and constant but is been gradually improving.  Denies any neck pain no slurred speech confusion no head injuries no numbness or weakness in any extremities.  He states he does not have headaches very often.  He states he has not eaten very much food today.  Denies any nausea currently.  Physical exam unremarkable no neurologic abnormalities found on my exam.  Emergent considerations for headache include subarachnoid hemorrhage, meningitis, temporal arteritis, glaucoma, cerebral ischemia, carotid/vertebral dissection, intracranial tumor, Venous sinus thrombosis, carbon monoxide poisoning, acute or chronic subdural hemorrhage.  Other considerations include: Migraine, Cluster headache, Hypertension, Caffeine, alcohol, or drug withdrawal, Pseudotumor cerebri, Arteriovenous malformation, Head injury, Neurocysticercosis, Post-lumbar puncture, Preeclampsia, Tension headache, Sinusitis, Cervical arthritis, Refractive error causing strain, Dental abscess, Otitis media, Temporomandibular joint syndrome, Depression, Somatoform disorder (eg, somatization) Trigeminal neuralgia, Glossopharyngeal neuralgia.   I think most likely diagnosis is tension headache versus headache due to dehydration/overexertion yesterday.  He does indicate that he was working in the sun most of the day he states he does not to drink water.  He has no specific red flag neurologic symptoms or thunderclap headache symptoms.  His symptoms are nonpositional.  Offered patient medication here in the emergency department he states he would prefer p.o. medication at home.  Will discharge home with Reglan p.o. and Benadryl p.o.  Recommend outpatient follow-up, return to emergency room for any new or concerning symptoms.   He is agreeable to plan.  Will discharge home at this time   Final diagnoses:  Bad headache    ED Discharge Orders          Ordered    metoCLOPramide (REGLAN) 10 MG tablet  Every 6 hours        04/06/24 1426    diphenhydrAMINE (BENADRYL) 25 MG tablet  Every 6 hours PRN        04/06/24 1426               Neldon Inoue Blair, [place] 04/06/24 1638    Freddi Hamilton, MD 04/07/24 0740

## 2024-04-06 NOTE — Discharge Instructions (Addendum)
 Make sure you are drinking plenty of water, take Reglan as needed as discussed for headaches.  I do recommend that you stay out of work for the next 24 hours and rest and hydrate.  You may always return the emergency room for any new or concerning symptoms.  Specifically if you develop a worsening headache. Any slurring of your speech, confusion, numbness or weakness of any arm or leg or drooping of your face should prompt you to immediately call 911.

## 2024-04-06 NOTE — ED Notes (Signed)
 RN provided AVS using Teachback Method. Patient verbalizes understanding of Discharge Instructions. Opportunity for Questioning and Answers were provided by RN. Patient Discharged from ED ambulatory to home via self.

## 2024-04-06 NOTE — ED Triage Notes (Signed)
 Pt POV steady gait- reports emesis x2 this AM, followed by ongoing headache. Denies fever, diarrhea. Poor po intake today. Denies nausea at time of triage.

## 2024-06-18 ENCOUNTER — Emergency Department (HOSPITAL_COMMUNITY)
Admission: EM | Admit: 2024-06-18 | Discharge: 2024-06-18 | Disposition: A | Discharge status: Home / Self Care | Payer: Self-pay | Attending: Emergency Medicine | Admitting: Emergency Medicine

## 2024-06-18 ENCOUNTER — Encounter (HOSPITAL_COMMUNITY): Payer: Self-pay

## 2024-06-18 DIAGNOSIS — R1084 Generalized abdominal pain: Secondary | ICD-10-CM | POA: Insufficient documentation

## 2024-06-18 DIAGNOSIS — R112 Nausea with vomiting, unspecified: Secondary | ICD-10-CM | POA: Insufficient documentation

## 2024-06-18 LAB — URINALYSIS, ROUTINE W REFLEX MICROSCOPIC
Bilirubin Urine: NEGATIVE
Glucose, UA: NEGATIVE mg/dL
Hgb urine dipstick: NEGATIVE
Ketones, ur: NEGATIVE mg/dL
Leukocytes,Ua: NEGATIVE
Nitrite: NEGATIVE
Protein, ur: NEGATIVE mg/dL
Specific Gravity, Urine: 1.019 (ref 1.005–1.030)
pH: 6 (ref 5.0–8.0)

## 2024-06-18 LAB — CBC
HCT: 40.3 % (ref 39.0–52.0)
Hemoglobin: 13.1 g/dL (ref 13.0–17.0)
MCH: 29.8 pg (ref 26.0–34.0)
MCHC: 32.5 g/dL (ref 30.0–36.0)
MCV: 91.8 fL (ref 80.0–100.0)
Platelets: 291 K/uL (ref 150–400)
RBC: 4.39 MIL/uL (ref 4.22–5.81)
RDW: 12.8 % (ref 11.5–15.5)
WBC: 4.1 K/uL (ref 4.0–10.5)
nRBC: 0 % (ref 0.0–0.2)

## 2024-06-18 LAB — COMPREHENSIVE METABOLIC PANEL WITH GFR
ALT: 20 U/L (ref 0–44)
AST: 23 U/L (ref 15–41)
Albumin: 4.2 g/dL (ref 3.5–5.0)
Alkaline Phosphatase: 86 U/L (ref 38–126)
Anion gap: 10 (ref 5–15)
BUN: 13 mg/dL (ref 6–20)
CO2: 23 mmol/L (ref 22–32)
Calcium: 9 mg/dL (ref 8.9–10.3)
Chloride: 106 mmol/L (ref 98–111)
Creatinine, Ser: 0.86 mg/dL (ref 0.61–1.24)
GFR, Estimated: >60 mL/min (ref >60)
Glucose, Bld: 89 mg/dL (ref 70–99)
Potassium: 4.1 mmol/L (ref 3.5–5.1)
Sodium: 139 mmol/L (ref 135–145)
Total Bilirubin: 0.6 mg/dL (ref 0.0–1.2)
Total Protein: 6.6 g/dL (ref 6.5–8.1)

## 2024-06-18 LAB — LIPASE, BLOOD: Lipase: 32 U/L (ref 11–51)

## 2024-06-18 MED ORDER — ONDANSETRON HCL 4 MG/2ML IJ SOLN
4.0000 mg | Freq: Once | INTRAMUSCULAR | Status: AC
  Administered 2024-06-18: 4 mg via INTRAVENOUS
  Filled 2024-06-18: qty 2

## 2024-06-18 MED ORDER — SODIUM CHLORIDE 0.9 % IV BOLUS
1000.0000 mL | Freq: Once | INTRAVENOUS | Status: AC
  Administered 2024-06-18: 1000 mL via INTRAVENOUS

## 2024-06-18 MED ORDER — ONDANSETRON 4 MG PO TBDP: 4.0000 mg | ORAL_TABLET | Freq: Three times a day (TID) | ORAL | 0 refills | Status: AC | PRN

## 2024-06-18 NOTE — ED Triage Notes (Signed)
 Pt presents with c/o abdominal pain and emesis that started this morning. Pt reports the pain is generalized in location.

## 2024-06-18 NOTE — ED Provider Notes (Signed)
 [place] EMERGENCY DEPARTMENT AT [facility] Provider Note   CSN: [id number] Arrival date & time: 06/18/24  9251     Patient presents with: Abdominal Pain   Darryl Lloyd is a 33 y.o. male.   Patient with no pertinent past medical history presents today with complaints of abdominal pain, nausea, vomiting. Reports that he woke up this morning with some mild cramping abdominal pain. Reports he did not think much of it and tried to go about his day, but then vomited once and did not feel like he could go to work, so presented here for evaluation. Reports his pain has improved, and he only had 1 episode of vomiting. Denies diarrhea, is having regular bowel movements and passing flatus. No history of abdominal surgeries.   The history is provided by the patient. No language interpreter was used.  Abdominal Pain      Prior to Admission medications   Medication Sig Start Date End Date Taking? Authorizing Provider  albuterol  (VENTOLIN  HFA) 108 (90 Base) MCG/ACT inhaler Inhale 1-2 puffs into the lungs every 6 (six) hours as needed for wheezing or shortness of breath. 07/10/22   Prosperi, Christian H, PA-C  bacitracin  ointment Apply 1 application topically every 8 (eight) hours. 08/16/19   Marilee Leach, MD  cephALEXin  (KEFLEX ) 500 MG capsule Take 1 capsule (500 mg total) by mouth 4 (four) times daily. 04/01/22   Roemhildt, Lorin T, PA-C  dicyclomine  (BENTYL ) 20 MG tablet Take 1 tablet (20 mg total) by mouth 2 (two) times daily. 09/11/20   Vonn Hadassah LABOR, PA-C  diphenhydrAMINE  (BENADRYL ) 25 MG tablet Take 1 tablet (25 mg total) by mouth every 6 (six) hours as needed. 04/06/24   Neldon Hamp RAMAN, PA  HYDROcodone -acetaminophen  (NORCO/VICODIN) 5-325 MG tablet Take 1 tablet by mouth every 4 (four) hours as needed for pain. 08/03/19   [provider]  methocarbamol  (ROBAXIN ) 500 MG tablet Take 1 tablet (500 mg total) by mouth every 8 (eight) hours as needed for muscle spasms.  04/11/20   Patsey Lot, MD  methocarbamol  (ROBAXIN ) 500 MG tablet Take 1 tablet (500 mg total) by mouth 2 (two) times daily. 11/13/21   Shepard, Margaux, PA-C  metoCLOPramide  (REGLAN ) 10 MG tablet Take 1 tablet (10 mg total) by mouth every 6 (six) hours. 04/06/24   Neldon Hamp RAMAN, PA  naproxen  (NAPROSYN ) 500 MG tablet Take 1 tablet (500 mg total) by mouth 2 (two) times daily. 11/13/21   Venter, Margaux, PA-C  ondansetron  (ZOFRAN -ODT) 8 MG disintegrating tablet Take 1 tablet (8 mg total) by mouth every 8 (eight) hours as needed for nausea or vomiting. 09/15/21   Randol Simmonds, MD    Allergies: Patient has no known allergies.    Review of Systems  Gastrointestinal:  Positive for abdominal pain.  All other systems reviewed and are negative.   Updated Vital Signs BP 113/63 (BP Location: Left Arm)   Pulse 69   Temp 98.7 F (37.1 C) (Oral)   Resp 16   SpO2 100%   Physical Exam Vitals and nursing note reviewed.  Constitutional:      General: He is not in acute distress.    Appearance: Normal appearance. He is normal weight. He is not ill-appearing, toxic-appearing or diaphoretic.  HENT:     Head: Normocephalic and atraumatic.  Cardiovascular:     Rate and Rhythm: Normal rate.  Pulmonary:     Effort: Pulmonary effort is normal. No respiratory distress.  Abdominal:     General: Abdomen  is flat.     Palpations: Abdomen is soft.     Tenderness: There is no abdominal tenderness.  Musculoskeletal:        General: Normal range of motion.     Cervical back: Normal range of motion.  Skin:    General: Skin is warm and dry.  Neurological:     General: No focal deficit present.     Mental Status: He is alert.  Psychiatric:        Mood and Affect: Mood normal.        Behavior: Behavior normal.     (all labs ordered are listed, but only abnormal results are displayed) Labs Reviewed  LIPASE, BLOOD  COMPREHENSIVE METABOLIC PANEL WITH GFR  CBC  URINALYSIS, ROUTINE W REFLEX MICROSCOPIC     EKG: None  Radiology: No results found.   Procedures   Medications Ordered in the ED  sodium chloride  0.9 % bolus 1,000 mL (1,000 mLs Intravenous New Bag/Given 06/18/24 0836)  ondansetron  (ZOFRAN ) injection 4 mg (4 mg Intravenous Given 06/18/24 0836)                                    Medical Decision Making Amount and/or Complexity of Data Reviewed Labs: ordered.  Risk Prescription drug management.   This patient is a 33 y.o. male who presents to the ED for concern of abdominal pain, this involves an extensive number of treatment options, and is a complaint that carries with it a high risk of complications and morbidity. The emergent differential diagnosis prior to evaluation includes, but is not limited to,  The differential diagnosis for generalized abdominal pain includes, but is not limited to AAA, gastroenteritis, appendicitis, Bowel obstruction, Bowel perforation. Gastroparesis, DKA, Hernia, Inflammatory bowel disease, mesenteric ischemia, pancreatitis, peritonitis SBP, volvulus.  This is not an exhaustive differential.   Past Medical History / Co-morbidities / Social History:  has a past medical history of Difficult intubation and Hemorrhoids.  Additional history: Chart reviewed.  Physical Exam: Physical exam performed. The pertinent findings include: well appearing, abdomen soft and nontender  Lab Tests: I ordered, and personally interpreted labs.  The pertinent results include:  no acute laboratory abnormalities  Medications: I ordered medication including zofran , IV fluids  for nausea, dehydration. Reevaluation of the patient after these medicines showed that the patient improved. I have reviewed the patients home medicines and have made adjustments as needed.   Disposition: After consideration of the diagnostic results and the patients response to treatment, I feel that emergency department workup does not suggest an emergent condition requiring admission  or immediate intervention beyond what has been performed at this time. The plan is: discharge with close outpatient follow-up and return precautions. Patient is nontoxic, nonseptic appearing, in no apparent distress.  Patient's pain and other symptoms adequately managed in emergency department.  Fluid bolus given.  Labs and vitals reviewed.  Patient does not meet the SIRS or Sepsis criteria.  On repeat exam patient does not have a surgical abdomin and there are no peritoneal signs.  No indication of appendicitis, bowel obstruction, bowel perforation, cholecystitis, diverticulitis. Given his benign physical exam and evaluation, no further evaluation is indicated with CT imaging.  Discussed same patient who is understanding and in agreement with this. He feels well to go home. Will give zofran  for additional symptomatic relief as needed. Evaluation and diagnostic testing in the emergency department does not suggest an emergent  condition requiring admission or immediate intervention beyond what has been performed at this time.  Plan for discharge with close PCP follow-up.  Patient is understanding and amenable with plan, educated on red flag symptoms that would prompt immediate return.  Patient discharged in stable condition.  Final diagnoses:  Generalized abdominal pain    ED Discharge Orders          Ordered    ondansetron  (ZOFRAN -ODT) 4 MG disintegrating tablet  Every 8 hours PRN        06/18/24 1126          An After Visit Summary was printed and given to the patient.      Darryl Lauraine LABOR, PA-C 06/18/24 1127    Darryl Jayson LABOR, DO 07/01/24 [phone]

## 2024-06-18 NOTE — Discharge Instructions (Signed)
 As we discussed, your workup in the ER today was reassuring for acute findings.  Laboratory evaluation did not reveal any emergent cause of your symptoms.  Given you are feeling better with interventions today, no further evaluation is indicated.  I have given you a prescription for Zofran  you can take as prescribed as needed for any residual nausea or vomiting.  Otherwise, call your PCP to schedule close follow-up appointment.  Return if development of any new or worsening symptoms.

## 2024-07-13 ENCOUNTER — Other Ambulatory Visit: Payer: Self-pay

## 2024-07-13 ENCOUNTER — Emergency Department (HOSPITAL_COMMUNITY)
Admission: EM | Admit: 2024-07-13 | Discharge: 2024-07-13 | Disposition: A | Discharge status: Home / Self Care | Payer: Self-pay | Attending: Emergency Medicine | Admitting: Emergency Medicine

## 2024-07-13 ENCOUNTER — Encounter (HOSPITAL_COMMUNITY): Payer: Self-pay

## 2024-07-13 DIAGNOSIS — J069 Acute upper respiratory infection, unspecified: Secondary | ICD-10-CM | POA: Insufficient documentation

## 2024-07-13 LAB — RESP PANEL BY RT-PCR (RSV, FLU A&B, COVID)  RVPGX2
Influenza A by PCR: NEGATIVE
Influenza B by PCR: NEGATIVE
Resp Syncytial Virus by PCR: NEGATIVE
SARS Coronavirus 2 by RT PCR: NEGATIVE

## 2024-07-13 LAB — GROUP A STREP BY PCR: Group A Strep by PCR: NOT DETECTED

## 2024-07-13 NOTE — ED Triage Notes (Signed)
 Patient has had a sore throat, headache, body aches, chills for 2 days.

## 2024-07-13 NOTE — ED Provider Notes (Signed)
 [place] EMERGENCY DEPARTMENT AT [facility] Provider Note   CSN: [id number] Arrival date & time: 07/13/24  1307     Patient presents with: Sore Throat and Generalized Body Aches   Darryl Lloyd is a 33 y.o. male with overall noncontributory past medical history presents concern for sore throat, headache, body aches, chills for 2 days.  Patient also mentions that he has been having some intermittent belly pain, decreased appetite, and some weight loss ongoing for several months.  He was seen around 1 month ago for similar.  Reports that symptoms were not significantly changed since then.  He has not followed up with GI doctor, family medicine doctor.  His workup 1 month ago was unremarkable.  He denies any fever by thermometer at home but does report some cold chills.  He denies any sick contacts.  He denies any alcohol, drug use.  He has not taken anything for his symptoms.    Sore Throat       Prior to Admission medications   Medication Sig Start Date End Date Taking? Authorizing Provider  albuterol  (VENTOLIN  HFA) 108 (90 Base) MCG/ACT inhaler Inhale 1-2 puffs into the lungs every 6 (six) hours as needed for wheezing or shortness of breath. 07/10/22   Lasheika Ortloff H, PA-C  bacitracin  ointment Apply 1 application topically every 8 (eight) hours. 08/16/19   Marilee Leach, MD  cephALEXin  (KEFLEX ) 500 MG capsule Take 1 capsule (500 mg total) by mouth 4 (four) times daily. 04/01/22   Roemhildt, Lorin T, PA-C  dicyclomine  (BENTYL ) 20 MG tablet Take 1 tablet (20 mg total) by mouth 2 (two) times daily. 09/11/20   Vonn Hadassah LABOR, PA-C  diphenhydrAMINE  (BENADRYL ) 25 MG tablet Take 1 tablet (25 mg total) by mouth every 6 (six) hours as needed. 04/06/24   Neldon Hamp RAMAN, PA  HYDROcodone -acetaminophen  (NORCO/VICODIN) 5-325 MG tablet Take 1 tablet by mouth every 4 (four) hours as needed for pain. 08/03/19   [provider]  methocarbamol  (ROBAXIN ) 500 MG tablet Take 1  tablet (500 mg total) by mouth every 8 (eight) hours as needed for muscle spasms. 04/11/20   Patsey Lot, MD  methocarbamol  (ROBAXIN ) 500 MG tablet Take 1 tablet (500 mg total) by mouth 2 (two) times daily. 11/13/21   Shepard, Margaux, PA-C  metoCLOPramide  (REGLAN ) 10 MG tablet Take 1 tablet (10 mg total) by mouth every 6 (six) hours. 04/06/24   Neldon Hamp RAMAN, PA  naproxen  (NAPROSYN ) 500 MG tablet Take 1 tablet (500 mg total) by mouth 2 (two) times daily. 11/13/21   Venter, Margaux, PA-C  ondansetron  (ZOFRAN -ODT) 4 MG disintegrating tablet Take 1 tablet (4 mg total) by mouth every 8 (eight) hours as needed. 06/18/24   Smoot, Lauraine LABOR, PA-C    Allergies: Patient has no known allergies.    Review of Systems  All other systems reviewed and are negative.   Updated Vital Signs BP 117/85 (BP Location: Left Arm)   Pulse 96   Temp 98.5 F (36.9 C) (Oral)   Resp 18   Ht 5' 10 (1.778 m)   Wt 62.1 kg   SpO2 99%   BMI 19.66 kg/m   Physical Exam Vitals and nursing note reviewed.  Constitutional:      General: He is not in acute distress.    Appearance: Normal appearance.  HENT:     Head: Normocephalic and atraumatic.     Mouth/Throat:     Comments: No significant posterior oropharynx erythema, swelling, exudate. Uvula midline,  tonsils 1+ bilaterally.  No trismus, stridor, evidence of PTA, floor of mouth swelling or redness.   Eyes:     General:        Right eye: No discharge.        Left eye: No discharge.  Cardiovascular:     Rate and Rhythm: Normal rate and regular rhythm.     Heart sounds: No murmur heard.    No friction rub. No gallop.  Pulmonary:     Effort: Pulmonary effort is normal.     Breath sounds: Normal breath sounds.     Comments: Clear breath sounds bilaterally, no wheezing, stridor, rhonchi, rales.  No focal consolidation noted. Abdominal:     General: Bowel sounds are normal.     Palpations: Abdomen is soft.  Skin:    General: Skin is warm and dry.      Capillary Refill: Capillary refill takes less than 2 seconds.  Neurological:     Mental Status: He is alert and oriented to person, place, and time.  Psychiatric:        Mood and Affect: Mood normal.        Behavior: Behavior normal.     (all labs ordered are listed, but only abnormal results are displayed) Labs Reviewed  RESP PANEL BY RT-PCR (RSV, FLU A&B, COVID)  [id number]  GROUP A STREP BY PCR    EKG: None  Radiology: No results found.   Procedures   Medications Ordered in the ED - No data to display                                  Medical Decision Making  This is a well-appearing 33 year old male who presents with concern for 2 days of cough, fever, sore throat.  My emergent differential diagnosis includes acute upper respiratory infection with COVID, flu, RSV versus new asthma presentation, acute bronchitis, less clinical concern for pneumonia.  Also considered other ENT emergencies, Ludwig angina, strep pharyngitis, mono, versus epiglottis, tonsillitis versus other.  This is not an exhaustive differential.  On my exam patient is overall well-appearing, they have temperature of 98.5, breathing unlabored, no tachypnea, no respiratory distress, stable oxygen saturation.  Patient without tachycardia.  RVP independently reviewed by myself shows negative for COVID, flu, RSV, strep PCR negative..  Patient symptoms are consistent with viral upper respiratory infection.  I had a discussion with the patient regarding his report of some generalized abdominal pain, decreased appetite, and slow weight loss over the last few months.  He was evaluated around 3 weeks ago for similar, with completely normal workup, unremarkable lab work at that time.  I do not see any physical exam signs of severe decompensated metastatic cancer, his vital signs are unremarkable, overall do not see any indication for emergent workup of patient's description of chronic issue with his appetite.  He is not cachectic,  abdomen is nondistended, no evidence of jaundice, no severe pain throughout, think reasonable for GI/PCP follow-up regarding his decreased appetite.  Symptoms today consistent with viral upper respiratory infection.  Encouraged ibuprofen , Tylenol , rest, plenty of fluids.  Discussed extensive return precautions.  Patient discharged in stable condition at this time. Final diagnoses:  Upper respiratory tract infection, unspecified type    ED Discharge Orders     None          Rosan Sherlean VEAR DEVONNA 07/13/24 1805    Geraldene Hamilton, MD 07/16/24 1857

## 2024-07-13 NOTE — Discharge Instructions (Signed)
 Your symptoms today seem consistent with an upper respiratory infection likely caused by a virus.  Recommend over-the-counter cough and cold medications such as DayQuil, NyQuil, Robitussin, Sinex as needed for your symptoms.  You can take the ibuprofen  and Tylenol  as needed for any developing fever.  Make sure that you are drinking plenty fluids including electrolyte containing fluids such as Gatorade, Pedialyte.  Your vital signs were very stable in the emergency department.  Given that you have been feeling generally unwell for slightly longer than her discussion today but you have had a normal workup in the emergency department as recently as less than 1 month ago, I think would be appropriate to discuss your general fatigue and decreased appetite with your primary care doctor or GI doctor.  I have put some contacts above that you can contact to discuss the symptoms.  Please return if you have severe worsening abdominal pain, nausea, vomiting, fever that does not improve with ibuprofen , Tylenol .
